# Patient Record
Sex: Female | Born: 1990 | Hispanic: Yes | Marital: Single | Smoking: Never smoker
Health system: Southern US, Community
[De-identification: ages and names within clinical notes are randomized; demographics above are authoritative.]

## PROBLEM LIST (undated history)

## (undated) ENCOUNTER — Inpatient Hospital Stay: Payer: Self-pay

## (undated) DIAGNOSIS — O021 Missed abortion: Secondary | ICD-10-CM

## (undated) DIAGNOSIS — Z800 Family history of malignant neoplasm of digestive organs: Secondary | ICD-10-CM

## (undated) DIAGNOSIS — E669 Obesity, unspecified: Secondary | ICD-10-CM

## (undated) DIAGNOSIS — Z8041 Family history of malignant neoplasm of ovary: Secondary | ICD-10-CM

## (undated) DIAGNOSIS — G43909 Migraine, unspecified, not intractable, without status migrainosus: Secondary | ICD-10-CM

## (undated) HISTORY — DX: Family history of malignant neoplasm of ovary: Z80.41

## (undated) HISTORY — PX: DILATION AND CURETTAGE OF UTERUS: SHX78

## (undated) HISTORY — DX: Family history of malignant neoplasm of digestive organs: Z80.0

## (undated) HISTORY — DX: Migraine, unspecified, not intractable, without status migrainosus: G43.909

## (undated) HISTORY — DX: Missed abortion: O02.1

## (undated) SURGERY — Surgical Case
Anesthesia: Spinal

---
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
## 2010-04-01 ENCOUNTER — Inpatient Hospital Stay: Payer: Self-pay | Admitting: Internal Medicine

## 2010-05-01 ENCOUNTER — Emergency Department: Payer: Self-pay | Admitting: Emergency Medicine

## 2010-06-04 ENCOUNTER — Emergency Department: Payer: Self-pay | Admitting: Emergency Medicine

## 2010-06-05 ENCOUNTER — Ambulatory Visit: Payer: Self-pay | Admitting: Obstetrics & Gynecology

## 2011-01-24 ENCOUNTER — Emergency Department: Payer: Self-pay | Admitting: Emergency Medicine

## 2011-07-23 ENCOUNTER — Inpatient Hospital Stay: Payer: Self-pay | Admitting: Obstetrics and Gynecology

## 2012-08-06 ENCOUNTER — Emergency Department: Payer: Self-pay | Admitting: Emergency Medicine

## 2015-01-27 LAB — HM PAP SMEAR

## 2015-02-25 ENCOUNTER — Other Ambulatory Visit: Payer: Self-pay

## 2015-02-25 ENCOUNTER — Encounter: Payer: Self-pay | Admitting: *Deleted

## 2015-02-25 DIAGNOSIS — O034 Incomplete spontaneous abortion without complication: Secondary | ICD-10-CM | POA: Diagnosis present

## 2015-02-25 DIAGNOSIS — M2629 Other anomalies of dental arch relationship: Secondary | ICD-10-CM | POA: Diagnosis not present

## 2015-02-25 DIAGNOSIS — Z79899 Other long term (current) drug therapy: Secondary | ICD-10-CM | POA: Diagnosis not present

## 2015-02-26 ENCOUNTER — Ambulatory Visit: Payer: Medicaid Other | Admitting: Certified Registered Nurse Anesthetist

## 2015-02-26 ENCOUNTER — Encounter
Admission: RE | Disposition: A | Discharge status: Home / Self Care | Payer: Self-pay | Source: Ambulatory Visit | Attending: Obstetrics & Gynecology

## 2015-02-26 ENCOUNTER — Encounter: Payer: Self-pay | Admitting: *Deleted

## 2015-02-26 ENCOUNTER — Ambulatory Visit
Admission: RE | Admit: 2015-02-26 | Discharge: 2015-02-26 | Disposition: A | Discharge status: Home / Self Care | Payer: Medicaid Other | Source: Ambulatory Visit | Attending: Obstetrics & Gynecology | Admitting: Obstetrics & Gynecology

## 2015-02-26 DIAGNOSIS — O034 Incomplete spontaneous abortion without complication: Secondary | ICD-10-CM | POA: Insufficient documentation

## 2015-02-26 DIAGNOSIS — Z79899 Other long term (current) drug therapy: Secondary | ICD-10-CM | POA: Insufficient documentation

## 2015-02-26 DIAGNOSIS — M2629 Other anomalies of dental arch relationship: Secondary | ICD-10-CM | POA: Insufficient documentation

## 2015-02-26 HISTORY — PX: DILATION AND EVACUATION: SHX1459

## 2015-02-26 LAB — TYPE AND SCREEN
ABO/RH(D): A POS
ANTIBODY SCREEN: NEGATIVE

## 2015-02-26 SURGERY — DILATION AND EVACUATION, UTERUS
Anesthesia: General

## 2015-02-26 MED ORDER — PROPOFOL 10 MG/ML IV BOLUS
INTRAVENOUS | Status: DC | PRN
  Administered 2015-02-26: 120 mg via INTRAVENOUS

## 2015-02-26 MED ORDER — FENTANYL CITRATE (PF) 100 MCG/2ML IJ SOLN
INTRAMUSCULAR | Status: DC | PRN
Start: 2015-02-26 — End: 2015-02-26
  Administered 2015-02-26: 100 ug via INTRAVENOUS

## 2015-02-26 MED ORDER — ONDANSETRON HCL 4 MG/2ML IJ SOLN
INTRAMUSCULAR | Status: DC | PRN
Start: 2015-02-26 — End: 2015-02-26
  Administered 2015-02-26: 4 mg via INTRAVENOUS

## 2015-02-26 MED ORDER — KETOROLAC TROMETHAMINE 15 MG/ML IJ SOLN
INTRAMUSCULAR | Status: DC | PRN
  Administered 2015-02-26: 15 mg via INTRAVENOUS

## 2015-02-26 MED ORDER — MIDAZOLAM HCL 2 MG/2ML IJ SOLN
INTRAMUSCULAR | Status: DC | PRN
  Administered 2015-02-26: 2 mg via INTRAVENOUS

## 2015-02-26 MED ORDER — FENTANYL CITRATE (PF) 100 MCG/2ML IJ SOLN
25.0000 ug | INTRAMUSCULAR | Status: DC | PRN
  Administered 2015-02-26 (×2): 25 ug via INTRAVENOUS

## 2015-02-26 MED ORDER — LACTATED RINGERS IV SOLN
INTRAVENOUS | Status: DC
  Administered 2015-02-26: 1000 mL via INTRAVENOUS

## 2015-02-26 MED ORDER — FENTANYL CITRATE (PF) 100 MCG/2ML IJ SOLN
INTRAMUSCULAR | Status: AC
  Filled 2015-02-26: qty 2

## 2015-02-26 MED ORDER — DOXYCYCLINE HYCLATE 50 MG PO CAPS: 100.0000 mg | ORAL_CAPSULE | Freq: Two times a day (BID) | ORAL | Status: DC

## 2015-02-26 MED ORDER — ONDANSETRON HCL 4 MG/2ML IJ SOLN: 4.0000 mg | Freq: Once | INTRAMUSCULAR | Status: DC | PRN

## 2015-02-26 MED ORDER — METHYLERGONOVINE MALEATE 0.2 MG PO TABS: 0.2000 mg | ORAL_TABLET | Freq: Four times a day (QID) | ORAL | Status: DC

## 2015-02-26 MED ORDER — IBUPROFEN 200 MG PO TABS: 200.0000 mg | ORAL_TABLET | Freq: Four times a day (QID) | ORAL | Status: DC | PRN

## 2015-02-26 MED ORDER — LIDOCAINE HCL (CARDIAC) 20 MG/ML IV SOLN
INTRAVENOUS | Status: DC | PRN
  Administered 2015-02-26: 100 mg via INTRAVENOUS

## 2015-02-26 MED ORDER — PHENYLEPHRINE HCL 10 MG/ML IJ SOLN
INTRAMUSCULAR | Status: DC | PRN
  Administered 2015-02-26: 100 ug via INTRAVENOUS

## 2015-02-26 SURGICAL SUPPLY — 18 items
BAG COUNTER SPONGE EZ (MISCELLANEOUS) ×2 IMPLANT
CATH ROBINSON RED A/P 16FR (CATHETERS) ×4 IMPLANT
FILTER UTR ASPR SPEC (MISCELLANEOUS) ×1 IMPLANT
FLTR UTR ASPR SPEC (MISCELLANEOUS) ×2
GLOVE BIO SURGEON STRL SZ8 (GLOVE) ×8 IMPLANT
GOWN STRL REUS W/ TWL LRG LVL3 (GOWN DISPOSABLE) ×1 IMPLANT
GOWN STRL REUS W/ TWL XL LVL3 (GOWN DISPOSABLE) ×1 IMPLANT
GOWN STRL REUS W/TWL LRG LVL3 (GOWN DISPOSABLE) ×1
GOWN STRL REUS W/TWL XL LVL3 (GOWN DISPOSABLE) ×1
KIT BERKELEY 1ST TRIMESTER 3/8 (MISCELLANEOUS) ×2 IMPLANT
KIT RM TURNOVER CYSTO AR (KITS) ×2 IMPLANT
NS IRRIG 500ML POUR BTL (IV SOLUTION) ×2 IMPLANT
PACK DNC HYST (MISCELLANEOUS) ×2 IMPLANT
PAD OB MATERNITY 4.3X12.25 (PERSONAL CARE ITEMS) ×2 IMPLANT
PAD PREP 24X41 OB/GYN DISP (PERSONAL CARE ITEMS) ×2 IMPLANT
SET BERKELEY SUCTION TUBING (SUCTIONS) ×4 IMPLANT
TOWEL OR 17X26 4PK STRL BLUE (TOWEL DISPOSABLE) ×2 IMPLANT
VACURETTE 8 RIGID CVD (CANNULA) ×2 IMPLANT

## 2015-02-26 NOTE — OR Nursing (Signed)
Type and screen drawn by lab personnel and going down to lab.

## 2015-02-26 NOTE — Anesthesia Preprocedure Evaluation (Signed)
Anesthesia Evaluation  Patient identified by MRN, date of birth, ID band Patient awake    Reviewed: Allergy & Precautions, NPO status , Patient's Chart, lab work & pertinent test results  Airway Mallampati: II       Dental  (+) Chipped   Pulmonary          Cardiovascular     Neuro/Psych    GI/Hepatic   Endo/Other    Renal/GU      Musculoskeletal   Abdominal   Peds  Hematology   Anesthesia Other Findings Overbite.  Reproductive/Obstetrics                             Anesthesia Physical Anesthesia Plan  ASA: II  Anesthesia Plan: General   Post-op Pain Management:    Induction: Intravenous  Airway Management Planned: LMA  Additional Equipment:   Intra-op Plan:   Post-operative Plan:   Informed Consent: I have reviewed the patients History and Physical, chart, labs and discussed the procedure including the risks, benefits and alternatives for the proposed anesthesia with the patient or authorized representative who has indicated his/her understanding and acceptance.     Plan Discussed with: CRNA  Anesthesia Plan Comments:         Anesthesia Quick Evaluation

## 2015-02-26 NOTE — Anesthesia Postprocedure Evaluation (Signed)
  Anesthesia Post-op Note  Patient: Amber Duffy  Procedure(s) Performed: Procedure(s): DILATATION AND EVACUATION (N/A)  Anesthesia type:General  Patient location: PACU  Post pain: Pain level controlled  Post assessment: Post-op Vital signs reviewed, Patient's Cardiovascular Status Stable, Respiratory Function Stable, Patent Airway and No signs of Nausea or vomiting  Post vital signs: Reviewed and stable  Last Vitals:  Filed Vitals:   02/26/15 1744  BP: 92/56  Pulse: 56  Temp: 36.2 C  Resp: 16    Level of consciousness: awake, alert  and patient cooperative  Complications: No apparent anesthesia complications

## 2015-02-26 NOTE — Discharge Instructions (Addendum)
General Gynecological Post-Operative Instructions You may expect to feel dizzy, weak, and drowsy for as long as 24 hours after receiving the medicine that made you sleep (anesthetic).  Do not drive a car, ride a bicycle, participate in physical activities, or take public transportation until you are done taking narcotic pain medicines or as directed by your doctor.  Do not drink alcohol or take tranquilizers.  Do not take medicine that has not been prescribed by your doctor.  Do not sign important papers or make important decisions while on narcotic pain medicines.  Have a responsible person with you.  Take showers instead of baths until your doctor gives you permission to take baths.  Avoid heavy lifting (more than 10 pounds/4.5 kilograms), pushing, or pulling.  Avoid activities that may risk injury to your surgical site.  No sexual intercourse or placement of anything in the vagina for 2 weeks or as instructed by your doctor. If you have tubes coming from the wound site, check with your doctor regarding appropriate care of the tubes. Only take prescription or over-the-counter medicines  for pain, discomfort, or fever as directed by your doctor. Do not take aspirin. It can make you bleed. Take medicines (antibiotics) that kill germs if they are prescribed for you.  Call the office or go to the ER if:  You feel sick to your stomach (nauseous) and you start to throw up (vomit).  You have trouble eating or drinking.  You have an oral temperature above 101.  You have constipation that is not helped by adjusting diet or increasing fluid intake. Pain medicines are a common cause of constipation.  You have any other concerns. SEEK IMMEDIATE MEDICAL CARE IF:  You have persistent dizziness.  You have difficulty breathing or a congested sounding (croupy) cough.  You have an oral temperature above 102.5, not controlled by medicine.  There is increasing pain or tenderness near or in the surgical site.    AMBULATORY SURGERY  DISCHARGE INSTRUCTIONS   1) The drugs that you were given will stay in your system until tomorrow so for the next 24 hours you should not:  A) Drive an automobile B) Make any legal decisions C) Drink any alcoholic beverage   2) You may resume regular meals tomorrow.  Today it is better to start with liquids and gradually work up to solid foods.  You may eat anything you prefer, but it is better to start with liquids, then soup and crackers, and gradually work up to solid foods.   3) Please notify your doctor immediately if you have any unusual bleeding, trouble breathing, redness and pain at the surgery site, drainage, fever, or pain not relieved by medication. 4)   5) Your post-operative visit with Dr.    Druscilla Brownie                                 is: Date:                        Time:    Please call to schedule your post-operative visit.  6) Additional Instructions: 7)

## 2015-02-26 NOTE — OR Nursing (Signed)
C/o nausea.  No vomiting.  Safety check done.  Offered Peppermint Oil one gtt on cotton ball in room for treatment of nausea.  Fluid bolus started to aid nausea and support b/p

## 2015-02-26 NOTE — Op Note (Signed)
  Operative Note  02/26/2015 4:43 PM  PRE-OP DIAGNOSIS: INCOMPLETE AB   POST-OP DIAGNOSIS: same  SURGEON: Annamarie Major, MD, FACOG  ANESTHESIA: Choice   PROCEDURE: Procedure(s): DILATATION AND EVACUATION   ESTIMATED BLOOD LOSS: Minimal   SPECIMENS: @ORSPECIMEN @   COMPLICATIONS: none  DISPOSITION: PACU - hemodynamically stable.  CONDITION: stable  FINDINGS: Exam under anesthesia revealed a 8 wk size uterus without palpable adnexal masses.   INDICATION FOR PROCEDURE: Incomplete Abortion  PROCEDURE IN DETAIL: After informed consent was obtained, the patient was taken to the operating room where anesthesia was obtained without difficulty. The patient was positioned in the dorsal lithotomy position with [employer]. Time out was performed and an exam under anesthesia was performed. The vagina, perineum, and lower abdomen were prepped and draped in a normal sterile fashion. The bladder was emptied with an I&O catheter. A speculum was placed into the vagina and the cervix was grasped with a single toothed tenaculum. The uterus was sounded to 10cm.  The cervix was gently dilated to 20 [place] with  [employer] dilators. The suction was then tested and found to be adequate, and a 2mm rigid suction cannula was advanced into the uterine cavity. The suction was activated and the contents of the uterus were aspirated until no further tissue was obtained. The uterus was then curetted to gritty texture throughout.  At the end of the procedure bleeding was noted to beMinimal.  All instruments were then removed from the vagina.The patient tolerated the procedure well. All sponge, instrument, and needle counts were correct. The patient was taken to the recovery room in good condition.

## 2015-02-26 NOTE — H&P (Signed)
History and Physical Interval Note:  02/26/2015 4:04 PM  Amber Duffy  has presented today for surgery, with the diagnosis of INCOMPLETE AB  The various methods of treatment have been discussed with the patient and family. After consideration of risks, benefits and other options for treatment, the patient has consented to  Procedure(s): DILATATION AND EVACUATION (N/A) as a surgical intervention .  The patient's history has been reviewed, patient examined, no change in status, stable for surgery.  Pt has the following beta blocker history-  Not taking Beta Blocker.  I have reviewed the patient's chart and labs.  Questions were answered to the patient's satisfaction.       Tehila Sokolow Renae Fickle

## 2015-02-26 NOTE — Transfer of Care (Signed)
Immediate Anesthesia Transfer of Care Note  Patient: Trisha Mangle  Procedure(s) Performed: Procedure(s): DILATATION AND EVACUATION (N/A)  Patient Location: PACU  Anesthesia Type:General  Level of Consciousness: awake, alert , oriented and patient cooperative  Airway & Oxygen Therapy: Patient Spontanous Breathing and Patient connected to nasal cannula oxygen  Post-op Assessment: Report given to RN and Post -op Vital signs reviewed and stable  Post vital signs: Reviewed and stable  Last Vitals:  Filed Vitals:   02/26/15 1648  BP: 104/68  Pulse: 68  Temp: 36.7 C  Resp: 16    Complications: No apparent anesthesia complications

## 2015-02-27 ENCOUNTER — Encounter: Payer: Self-pay | Admitting: Obstetrics & Gynecology

## 2015-02-27 LAB — ABO/RH: ABO/RH(D): A POS

## 2015-03-02 LAB — SURGICAL PATHOLOGY

## 2015-05-11 ENCOUNTER — Encounter: Payer: Self-pay | Admitting: Urgent Care

## 2015-05-11 ENCOUNTER — Emergency Department: Payer: Medicaid Other

## 2015-05-11 DIAGNOSIS — O209 Hemorrhage in early pregnancy, unspecified: Secondary | ICD-10-CM | POA: Diagnosis present

## 2015-05-11 DIAGNOSIS — Z79899 Other long term (current) drug therapy: Secondary | ICD-10-CM | POA: Insufficient documentation

## 2015-05-11 DIAGNOSIS — Z3A01 Less than 8 weeks gestation of pregnancy: Secondary | ICD-10-CM | POA: Diagnosis not present

## 2015-05-11 DIAGNOSIS — O200 Threatened abortion: Secondary | ICD-10-CM | POA: Diagnosis not present

## 2015-05-11 LAB — BASIC METABOLIC PANEL
Anion gap: 6 (ref 5–15)
BUN: 8 mg/dL (ref 6–20)
CO2: 25 mmol/L (ref 22–32)
Calcium: 9.4 mg/dL (ref 8.9–10.3)
Chloride: 104 mmol/L (ref 101–111)
Creatinine, Ser: 0.63 mg/dL (ref 0.44–1.00)
GFR calc Af Amer: >60 mL/min (ref >60)
GLUCOSE: 104 mg/dL — AB (ref 65–99)
Potassium: 3.4 mmol/L — ABNORMAL LOW (ref 3.5–5.1)
Sodium: 135 mmol/L (ref 135–145)

## 2015-05-11 LAB — CBC
HCT: 37.3 % (ref 35.0–47.0)
Hemoglobin: 12.3 g/dL (ref 12.0–16.0)
MCH: 28.4 pg (ref 26.0–34.0)
MCHC: 32.9 g/dL (ref 32.0–36.0)
MCV: 86.3 fL (ref 80.0–100.0)
PLATELETS: 213 10*3/uL (ref 150–440)
RBC: 4.33 MIL/uL (ref 3.80–5.20)
RDW: 13.4 % (ref 11.5–14.5)
WBC: 8.7 10*3/uL (ref 3.6–11.0)

## 2015-05-11 LAB — HCG, QUANTITATIVE, PREGNANCY: hCG, Beta Chain, Quant, S: 23228 m[IU]/mL — ABNORMAL HIGH (ref <5)

## 2015-05-11 NOTE — ED Notes (Signed)
Patient presents with c/o lower abd pain and vaginal bleeding x 2 days. Of note, patient is a G4-P1-A2 - unsure of IUP or gestational age - "I am about 3 months". Denies N/V/D/F.

## 2015-05-12 ENCOUNTER — Emergency Department
Admission: EM | Admit: 2015-05-12 | Discharge: 2015-05-12 | Disposition: A | Discharge status: Home / Self Care | Payer: Medicaid Other | Attending: Emergency Medicine | Admitting: Emergency Medicine

## 2015-05-12 DIAGNOSIS — O200 Threatened abortion: Secondary | ICD-10-CM

## 2015-05-12 LAB — ABO/RH: ABO/RH(D): A POS

## 2015-05-12 MED ORDER — OXYCODONE-ACETAMINOPHEN 5-325 MG PO TABS
1.0000 | ORAL_TABLET | Freq: Once | ORAL | Status: AC
  Administered 2015-05-12: 1 via ORAL
  Filled 2015-05-12: qty 1

## 2015-05-12 MED ORDER — ONDANSETRON 4 MG PO TBDP
4.0000 mg | ORAL_TABLET | Freq: Once | ORAL | Status: AC
  Administered 2015-05-12: 4 mg via ORAL
  Filled 2015-05-12: qty 1

## 2015-05-12 NOTE — ED Notes (Signed)
Patient with no complaints at this time. Respirations even and unlabored. Skin warm/dry. Discharge instructions reviewed with patient at this time. Patient given opportunity to voice concerns/ask questions. Patient discharged at this time and left Emergency Department with steady gait.   

## 2015-05-12 NOTE — Discharge Instructions (Signed)

## 2015-05-12 NOTE — ED Provider Notes (Signed)
[facility] Emergency Department Provider Note  ____________________________________________  Time seen: 12:15 AM  I have reviewed the triage vital signs and the nursing notes.   HISTORY  Chief Complaint Vaginal Bleeding and Abdominal Pain      HPI Amber Duffy is a 24 y.o. female presents with left lower quadrant abdominal pain and vaginal bleeding 2 days. Of note patient states she is approximately 3 months pregnant. Patient is a G4 para 1 with 2 miscarriages most recent of which was "May or June". Patient denies any nausea no vomiting no fever. Patient is followed by [facility]    Past medical history 2 miscarriages There are no active problems to display for this patient.   Past Surgical History  Procedure Laterality Date  . Dilation and curettage of uterus    . Dilation and evacuation N/A 02/26/2015    Procedure: DILATATION AND EVACUATION;  Surgeon: Nadara Mustard, MD;  Location: [facility] ORS;  Service: Gynecology;  Laterality: N/A;    Current Outpatient Rx  Name  Route  Sig  Dispense  Refill  . doxycycline (VIBRAMYCIN) 50 MG capsule   Oral   Take 2 capsules (100 mg total) by mouth 2 (two) times daily.   12 capsule   0   . Ferrous Fumarate (IRON) 18 MG TBCR   Oral   Take by mouth.         Marland Kitchen ibuprofen (ADVIL) 200 MG tablet   Oral   Take 1 tablet (200 mg total) by mouth every 6 (six) hours as needed for mild pain or moderate pain.   30 tablet   0   . methylergonovine (METHERGINE) 0.2 MG tablet   Oral   Take 1 tablet (0.2 mg total) by mouth 4 (four) times daily.   4 tablet   0     Allergies No known drug allergies No family history on file.  Social History Social History  Substance Use Topics  . Smoking status: Never Smoker   . Smokeless tobacco: Never Used  . Alcohol Use: No    Review of Systems  Constitutional: Negative for fever. Eyes: Negative for visual changes. ENT: Negative for sore  throat. Cardiovascular: Negative for chest pain. Respiratory: Negative for shortness of breath. Gastrointestinal: Negative for abdominal pain, vomiting and diarrhea. Genitourinary: Negative for dysuria. Musculoskeletal: Negative for back pain. Skin: Negative for rash. Neurological: Negative for headaches, focal weakness or numbness.   10-point ROS otherwise negative.  ____________________________________________   PHYSICAL EXAM:  VITAL SIGNS: ED Triage Vitals  Enc Vitals Group     BP 05/11/15 2119 103/62 mmHg     Pulse Rate 05/11/15 2119 86     Resp 05/11/15 2119 18     Temp 05/11/15 2119 97.6 F (36.4 C)     Temp Source 05/11/15 2119 Oral     SpO2 05/11/15 2119 98 %     Weight 05/11/15 2119 170 lb (77.111 kg)     Height 05/11/15 2119 5\' 3"  (1.6 m)     Head Cir --      Peak Flow --      Pain Score 05/11/15 2120 8     Pain Loc --      Pain Edu? --      Excl. in GC? --      Constitutional: Alert and oriented. Well appearing and in no distress. Eyes: Conjunctivae are normal. PERRL. Normal extraocular movements. ENT   Head: Normocephalic and atraumatic.   Nose: No congestion/rhinnorhea.  Mouth/Throat: Mucous membranes are moist.   Neck: No stridor. Hematological/Lymphatic/Immunilogical: No cervical lymphadenopathy. Cardiovascular: Normal rate, regular rhythm. Normal and symmetric distal pulses are present in all extremities. No murmurs, rubs, or gallops. Respiratory: Normal respiratory effort without tachypnea nor retractions. Breath sounds are clear and equal bilaterally. No wheezes/rales/rhonchi. Gastrointestinal: Left pelvic pain with palpation. No distention. There is no CVA tenderness. Genitourinary: deferred Musculoskeletal: Nontender with normal range of motion in all extremities. No joint effusions.  No lower extremity tenderness nor edema. Neurologic:  Normal speech and language. No gross focal neurologic deficits are appreciated. Speech is normal.   Skin:  Skin is warm, dry and intact. No rash noted. Psychiatric: Mood and affect are normal. Speech and behavior are normal. Patient exhibits appropriate insight and judgment.  ____________________________________________    LABS (pertinent positives/negatives)  Labs Reviewed  HCG, QUANTITATIVE, PREGNANCY - Abnormal; Notable for the following:    hCG, Beta Chain, Quant, S 23228 (*)    All other components within normal limits  BASIC METABOLIC PANEL - Abnormal; Notable for the following:    Potassium 3.4 (*)    Glucose, Bld 104 (*)    All other components within normal limits  CBC  ABO/RH     RADIOLOGY     US Ob Transvaginal (Final result) Result time: 05/11/15 23:43:40   Final result by Rad Results In Interface (05/11/15 23:43:40)   Narrative:   CLINICAL DATA: Vaginal bleeding. Pelvic pain. Quantitative beta HCG 23,228.  EXAM: OBSTETRIC <14 WK [place] AND TRANSVAGINAL OB US  TECHNIQUE: Both transabdominal and transvaginal ultrasound examinations were performed for complete evaluation of the gestation as well as the maternal uterus, adnexal regions, and pelvic cul-de-sac. Transvaginal technique was performed to assess early pregnancy.  COMPARISON: None.  FINDINGS: Intrauterine gestational sac: Visualized, mildly irregular  Yolk sac: Visualized  Embryo: Visualized  Cardiac Activity: Not visualized  Heart Rate: 0 bpm  CRL: 8 mm  6 w  6 d         [place] EDC: 12/29/15  Maternal uterus/adnexae: Left ovarian simple appearing cyst 3.1 x 2.5 x 2.4 cm. Right ovary appears normal. No free fluid.  IMPRESSION: Intrauterine irregular gestational sac with yolk sac and 8 mm fetal pole but no cardiac activity visualized. Findings meet definitive criteria for failed pregnancy. This follows SRU consensus guidelines: Diagnostic Criteria for Nonviable Pregnancy Early in the First Trimester. [facility] Mis J Med [phone].   Electronically Signed By:  Christiana Pellant M.D. On: 05/11/2015 23:43          US OB Comp Less 14 Wks (Final result) Result time: 05/11/15 23:43:40   Final result by Rad Results In Interface (05/11/15 23:43:40)   Narrative:   CLINICAL DATA: Vaginal bleeding. Pelvic pain. Quantitative beta HCG 23,228.  EXAM: OBSTETRIC <14 WK [place] AND TRANSVAGINAL OB US  TECHNIQUE: Both transabdominal and transvaginal ultrasound examinations were performed for complete evaluation of the gestation as well as the maternal uterus, adnexal regions, and pelvic cul-de-sac. Transvaginal technique was performed to assess early pregnancy.  COMPARISON: None.  FINDINGS: Intrauterine gestational sac: Visualized, mildly irregular  Yolk sac: Visualized  Embryo: Visualized  Cardiac Activity: Not visualized  Heart Rate: 0 bpm  CRL: 8 mm  6 w  6 d         [place] EDC: 12/29/15  Maternal uterus/adnexae: Left ovarian simple appearing cyst 3.1 x 2.5 x 2.4 cm. Right ovary appears normal. No free fluid.  IMPRESSION: Intrauterine irregular gestational sac with yolk sac and 8 mm fetal  pole but no cardiac activity visualized. Findings meet definitive criteria for failed pregnancy. This follows SRU consensus guidelines: Diagnostic Criteria for Nonviable Pregnancy Early in the First Trimester. [facility] Mis J Med [id number].   Electronically Signed By: Christiana Pellant M.D. On: 05/11/2015 23:43       INITIAL IMPRESSION / ASSESSMENT AND PLAN / ED COURSE  Pertinent labs & imaging results that were available during my care of the patient were reviewed by me and considered in my medical decision making (see chart for details).  I informed the patient of the irregular shape gestational sac with no cardiac activity and hence fetal demise  ____________________________________________   FINAL CLINICAL IMPRESSION(S) / ED DIAGNOSES  Final diagnoses:  Miscarriage, threatened, early pregnancy      Darci Current, MD 05/22/15 [id number]

## 2015-05-12 NOTE — ED Notes (Addendum)
MD at bedside, completing medical evaluation.  

## 2015-05-14 ENCOUNTER — Encounter: Payer: Self-pay | Admitting: *Deleted

## 2015-05-14 ENCOUNTER — Emergency Department
Admission: EM | Admit: 2015-05-14 | Discharge: 2015-05-14 | Disposition: A | Discharge status: Home / Self Care | Payer: Medicaid Other | Attending: Emergency Medicine | Admitting: Emergency Medicine

## 2015-05-14 DIAGNOSIS — S61216A Laceration without foreign body of right little finger without damage to nail, initial encounter: Secondary | ICD-10-CM | POA: Diagnosis not present

## 2015-05-14 DIAGNOSIS — Z79899 Other long term (current) drug therapy: Secondary | ICD-10-CM | POA: Diagnosis not present

## 2015-05-14 DIAGNOSIS — W260XXA Contact with knife, initial encounter: Secondary | ICD-10-CM | POA: Diagnosis not present

## 2015-05-14 DIAGNOSIS — Z792 Long term (current) use of antibiotics: Secondary | ICD-10-CM | POA: Insufficient documentation

## 2015-05-14 DIAGNOSIS — N939 Abnormal uterine and vaginal bleeding, unspecified: Secondary | ICD-10-CM | POA: Diagnosis not present

## 2015-05-14 DIAGNOSIS — Y9389 Activity, other specified: Secondary | ICD-10-CM | POA: Insufficient documentation

## 2015-05-14 DIAGNOSIS — Y9289 Other specified places as the place of occurrence of the external cause: Secondary | ICD-10-CM | POA: Diagnosis not present

## 2015-05-14 DIAGNOSIS — Y998 Other external cause status: Secondary | ICD-10-CM | POA: Diagnosis not present

## 2015-05-14 DIAGNOSIS — Z23 Encounter for immunization: Secondary | ICD-10-CM | POA: Diagnosis not present

## 2015-05-14 LAB — URINALYSIS COMPLETE WITH MICROSCOPIC (ARMC ONLY)
Bilirubin Urine: NEGATIVE
GLUCOSE, UA: NEGATIVE mg/dL
Ketones, ur: NEGATIVE mg/dL
Leukocytes, UA: NEGATIVE
NITRITE: NEGATIVE
Protein, ur: NEGATIVE mg/dL
Specific Gravity, Urine: 1.025 (ref 1.005–1.030)
pH: 5 (ref 5.0–8.0)

## 2015-05-14 LAB — BASIC METABOLIC PANEL
ANION GAP: 6 (ref 5–15)
BUN: 11 mg/dL (ref 6–20)
CALCIUM: 8.9 mg/dL (ref 8.9–10.3)
CO2: 26 mmol/L (ref 22–32)
Chloride: 107 mmol/L (ref 101–111)
Creatinine, Ser: 0.55 mg/dL (ref 0.44–1.00)
GFR calc non Af Amer: >60 mL/min (ref >60)
Glucose, Bld: 82 mg/dL (ref 65–99)
Potassium: 3.6 mmol/L (ref 3.5–5.1)
Sodium: 139 mmol/L (ref 135–145)

## 2015-05-14 LAB — CBC WITH DIFFERENTIAL/PLATELET
BASOS ABS: 0 10*3/uL (ref 0–0.1)
BASOS PCT: 1 %
Eosinophils Absolute: 0.1 10*3/uL (ref 0–0.7)
Eosinophils Relative: 1 %
HEMATOCRIT: 35.3 % (ref 35.0–47.0)
Hemoglobin: 11.7 g/dL — ABNORMAL LOW (ref 12.0–16.0)
Lymphocytes Relative: 26 %
Lymphs Abs: 2.2 10*3/uL (ref 1.0–3.6)
MCH: 28.8 pg (ref 26.0–34.0)
MCHC: 33.2 g/dL (ref 32.0–36.0)
MCV: 86.7 fL (ref 80.0–100.0)
Monocytes Absolute: 0.8 10*3/uL (ref 0.2–0.9)
Monocytes Relative: 10 %
NEUTROS ABS: 5.1 10*3/uL (ref 1.4–6.5)
NEUTROS PCT: 62 %
Platelets: 195 10*3/uL (ref 150–440)
RBC: 4.07 MIL/uL (ref 3.80–5.20)
RDW: 12.9 % (ref 11.5–14.5)
WBC: 8.3 10*3/uL (ref 3.6–11.0)

## 2015-05-14 LAB — HCG, QUANTITATIVE, PREGNANCY: HCG, BETA CHAIN, QUANT, S: 18259 m[IU]/mL — AB (ref <5)

## 2015-05-14 MED ORDER — LIDOCAINE-EPINEPHRINE (PF) 1 %-1:200000 IJ SOLN
30.0000 mL | Freq: Once | INTRAMUSCULAR | Status: AC
  Administered 2015-05-14: 30 mL via INTRADERMAL
  Filled 2015-05-14: qty 30

## 2015-05-14 MED ORDER — TETANUS-DIPHTH-ACELL PERTUSSIS 5-2.5-18.5 LF-MCG/0.5 IM SUSP
0.5000 mL | Freq: Once | INTRAMUSCULAR | Status: AC
  Administered 2015-05-14: 0.5 mL via INTRAMUSCULAR
  Filled 2015-05-14: qty 0.5

## 2015-05-14 NOTE — ED Provider Notes (Signed)
[facility] Emergency Department Provider Note  ____________________________________________  Time seen: 9:20 PM  I have reviewed the triage vital signs and the nursing notes.   HISTORY  Chief Complaint Vaginal Bleeding; Dizziness; and Laceration    HPI Amber Duffy is a 24 y.o. female who presents to the ED after cutting her right fifth finger with a knife today. She states that she is putting away but was not paying attention and cut the finger. No limitation in movement but it does hurt and had bled a lot initially. She is concerned that she may have lost much blood because she is anemic at baseline and recently had a miscarriage and has had some vaginal bleeding over the last 4 days. No syncope chest pain shortness of breath fever chills nausea vomiting or diarrhea.     No past medical history on file. Spontaneous abortion  There are no active problems to display for this patient.    Past Surgical History  Procedure Laterality Date  . Dilation and curettage of uterus    . Dilation and evacuation N/A 02/26/2015    Procedure: DILATATION AND EVACUATION;  Surgeon: Nadara Mustard, MD;  Location: [facility] ORS;  Service: Gynecology;  Laterality: N/A;     Current Outpatient Rx  Name  Route  Sig  Dispense  Refill  . doxycycline (VIBRAMYCIN) 50 MG capsule   Oral   Take 2 capsules (100 mg total) by mouth 2 (two) times daily.   12 capsule   0   . ibuprofen (ADVIL) 200 MG tablet   Oral   Take 1 tablet (200 mg total) by mouth every 6 (six) hours as needed for mild pain or moderate pain.   30 tablet   0   . methylergonovine (METHERGINE) 0.2 MG tablet   Oral   Take 1 tablet (0.2 mg total) by mouth 4 (four) times daily.   4 tablet   0      Allergies Review of patient's allergies indicates no known allergies.   No family history on file.  Social History Social History  Substance Use Topics  . Smoking status: Never Smoker   . Smokeless  tobacco: Never Used  . Alcohol Use: No    Review of Systems  Constitutional:   No fever or chills. No weight changes Eyes:   No blurry vision or double vision.  ENT:   No sore throat. Cardiovascular:   No chest pain. Respiratory:   No dyspnea or cough. Gastrointestinal:   Negative for abdominal pain, vomiting and diarrhea.  No BRBPR or melena. Genitourinary:   Negative for dysuria, urinary retention, bloody urine, or difficulty urinating. Recent miscarriage with persistent vaginal bleeding. No foul discharge or increasing pain. Musculoskeletal:   Negative for back pain. Right fifth finger pain and laceration Skin:   Negative for rash. Neurological:   Negative for headaches, focal weakness or numbness. Psychiatric:  No anxiety or depression.   Endocrine:  No hot/cold intolerance, changes in energy, or sleep difficulty.  10-point ROS otherwise negative.  ____________________________________________   PHYSICAL EXAM:  VITAL SIGNS: ED Triage Vitals  Enc Vitals Group     BP 05/14/15 2105 104/65 mmHg     Pulse Rate 05/14/15 2105 89     Resp 05/14/15 2105 20     Temp 05/14/15 2105 98.1 F (36.7 C)     Temp Source 05/14/15 2105 Oral     SpO2 05/14/15 2105 98 %     Weight 05/14/15 2105 170 lb (77.111  kg)     Height 05/14/15 2105  (1.6 m)     Head Cir --      Peak Flow --      Pain Score 05/14/15 2105 9     Pain Loc --      Pain Edu? --      Excl. in GC? --      Constitutional:   Alert and oriented. Well appearing and in no distress. Eyes:   No scleral icterus. No conjunctival pallor. PERRL. EOMI ENT   Head:   Normocephalic and atraumatic.   Nose:   No congestion/rhinnorhea. No septal hematoma   Mouth/Throat:   MMM, no pharyngeal erythema. No peritonsillar mass. No uvula shift.   Neck:   No stridor. No SubQ emphysema. No meningismus. Hematological/Lymphatic/Immunilogical:   No cervical lymphadenopathy. Cardiovascular:   RRR. Normal and symmetric distal  pulses are present in all extremities. No murmurs, rubs, or gallops. Respiratory:   Normal respiratory effort without tachypnea nor retractions. Breath sounds are clear and equal bilaterally. No wheezes/rales/rhonchi. Gastrointestinal:   Soft and nontender. No distention. There is no CVA tenderness.  No rebound, rigidity, or guarding. Genitourinary:   deferred Musculoskeletal:   Mild tenderness in the right fifth finger over the area of the laceration which is about 1 cm triangular shaped flap over the radial aspect of the right fifth finger distal phalanx. The nailbed is not involved. The joint does not appear to be involved. She has intact range of motion. Hemostatic on my exam, and the laceration extends only through the dermis and does not involve any tendons or neurovascular structures.. Neurologic:   Normal speech and language.  CN 2-10 normal. Motor grossly intact. No pronator drift.  Normal gait. No gross focal neurologic deficits are appreciated.  Skin:    Skin is warm, dry and intact. No rash noted.  No petechiae, purpura, or bullae. Psychiatric:   Mood and affect are normal. Speech and behavior are normal. Patient exhibits appropriate insight and judgment.  ____________________________________________    LABS (pertinent positives/negatives) (all labs ordered are listed, but only abnormal results are displayed) Labs Reviewed  HCG, QUANTITATIVE, PREGNANCY - Abnormal; Notable for the following:    hCG, Beta Chain, Quant, S 18259 (*)    All other components within normal limits  CBC WITH DIFFERENTIAL/PLATELET - Abnormal; Notable for the following:    Hemoglobin 11.7 (*)    All other components within normal limits  URINALYSIS COMPLETEWITH MICROSCOPIC ([facility] ONLY) - Abnormal; Notable for the following:    Color, Urine YELLOW (*)    APPearance CLEAR (*)    Hgb urine dipstick 3+ (*)    Bacteria, UA MANY (*)    Squamous Epithelial / LPF 0-5 (*)    All other components within normal  limits  BASIC METABOLIC PANEL   ____________________________________________   EKG    ____________________________________________    RADIOLOGY    ____________________________________________   PROCEDURES Digital block performed with 1% lidocaine with epinephrine at the base of the right fifth finger to obtain anesthesia for laceration repair.  LACERATION REPAIR Performed by: Sharman Cheek Authorized by: Sharman Cheek Consent: Verbal consent obtained. Risks and benefits: risks, benefits and alternatives were discussed Consent given by: patient Patient identity confirmed: provided demographic data Prepped and Draped in normal sterile fashion Wound explored  Laceration Location: Right fifth finger  Laceration Length: 1cm  No Foreign Bodies seen or palpated  Anesthesia: local infiltration  Local anesthetic: lidocaine 1% with epinephrine  Anesthetic total: 1  ml  Irrigation method: syringe Amount of cleaning: standard  Skin closure: 5-0 Monocryl   Number of sutures: 3  Technique: Simple interrupted   Patient tolerance: Patient tolerated the procedure well with no immediate complications.   ____________________________________________   INITIAL IMPRESSION / ASSESSMENT AND PLAN / ED COURSE  Pertinent labs & imaging results that were available during my care of the patient were reviewed by me and considered in my medical decision making (see chart for details).  Patient presents with finger laceration. The vaginal bleeding is expected clinical course for a spontaneous miscarriage, and there are no symptoms or findings to suggest that she is developing infection or has retained POC's. Finger injury will be cleansed and repaired with sutures, as location near the DIP makes it unlikely to heal optimally with Steri-Strips or adhesive skin glue. She is unable to remember her last tetanus shot so we'll give her a tetanus booster here in the  ED.     ____________________________________________   FINAL CLINICAL IMPRESSION(S) / ED DIAGNOSES  Final diagnoses:  Laceration of fifth finger, right, initial encounter      Sharman Cheek, MD 05/14/15 2240

## 2015-05-14 NOTE — ED Notes (Signed)
MD Scotty Court at bedside to answer questions regarding miscarriage/abdominal pain.

## 2015-05-14 NOTE — Discharge Instructions (Signed)
Your blood type is A+, which means the you do not need any additional medications related to your recent miscarriage to protect against any negative blood reactions. We repaired your finger laceration with absorbable sutures that will come out on their own.  Laceration Care, Adult A laceration is a cut or lesion that goes through all layers of the skin and into the tissue just beneath the skin. TREATMENT  Some lacerations may not require closure. Some lacerations may not be able to be closed due to an increased risk of infection. It is important to see your caregiver as soon as possible after an injury to minimize the risk of infection and maximize the opportunity for successful closure. If closure is appropriate, pain medicines may be given, if needed. The wound will be cleaned to help prevent infection. Your caregiver will use stitches (sutures), staples, wound glue (adhesive), or skin adhesive strips to repair the laceration. These tools bring the skin edges together to allow for faster healing and a better cosmetic outcome. However, all wounds will heal with a scar. Once the wound has healed, scarring can be minimized by covering the wound with sunscreen during the day for 1 full year. HOME CARE INSTRUCTIONS  For sutures or staples:  Keep the wound clean and dry.  If you were given a bandage (dressing), you should change it at least once a day. Also, change the dressing if it becomes wet or dirty, or as directed by your caregiver.  Wash the wound with soap and water 2 times a day. Rinse the wound off with water to remove all soap. Pat the wound dry with a clean towel.  After cleaning, apply a thin layer of the antibiotic ointment as recommended by your caregiver. This will help prevent infection and keep the dressing from sticking.  You may shower as usual after the first 24 hours. Do not soak the wound in water until the sutures are removed.  Only take over-the-counter or prescription  medicines for pain, discomfort, or fever as directed by your caregiver.  Get your sutures or staples removed as directed by your caregiver. For skin adhesive strips:  Keep the wound clean and dry.  Do not get the skin adhesive strips wet. You may bathe carefully, using caution to keep the wound dry.  If the wound gets wet, pat it dry with a clean towel.  Skin adhesive strips will fall off on their own. You may trim the strips as the wound heals. Do not remove skin adhesive strips that are still stuck to the wound. They will fall off in time. For wound adhesive:  You may briefly wet your wound in the shower or bath. Do not soak or scrub the wound. Do not swim. Avoid periods of heavy perspiration until the skin adhesive has fallen off on its own. After showering or bathing, gently pat the wound dry with a clean towel.  Do not apply liquid medicine, cream medicine, or ointment medicine to your wound while the skin adhesive is in place. This may loosen the film before your wound is healed.  If a dressing is placed over the wound, be careful not to apply tape directly over the skin adhesive. This may cause the adhesive to be pulled off before the wound is healed.  Avoid prolonged exposure to sunlight or tanning lamps while the skin adhesive is in place. Exposure to ultraviolet light in the first year will darken the scar.  The skin adhesive will usually remain in place  for 5 to 10 days, then naturally fall off the skin. Do not pick at the adhesive film. You may need a tetanus shot if:  You cannot remember when you had your last tetanus shot.  You have never had a tetanus shot. If you get a tetanus shot, your arm may swell, get red, and feel warm to the touch. This is common and not a problem. If you need a tetanus shot and you choose not to have one, there is a rare chance of getting tetanus. Sickness from tetanus can be serious. SEEK MEDICAL CARE IF:   You have redness, swelling, or  increasing pain in the wound.  You see a red line that goes away from the wound.  You have yellowish-white fluid (pus) coming from the wound.  You have a fever.  You notice a bad smell coming from the wound or dressing.  Your wound breaks open before or after sutures have been removed.  You notice something coming out of the wound such as wood or glass.  Your wound is on your hand or foot and you cannot move a finger or toe. SEEK IMMEDIATE MEDICAL CARE IF:   Your pain is not controlled with prescribed medicine.  You have severe swelling around the wound causing pain and numbness or a change in color in your arm, hand, leg, or foot.  Your wound splits open and starts bleeding.  You have worsening numbness, weakness, or loss of function of any joint around or beyond the wound.  You develop painful lumps near the wound or on the skin anywhere on your body. MAKE SURE YOU:   Understand these instructions.  Will watch your condition.  Will get help right away if you are not doing well or get worse. Document Released: 08/29/2005 Document Revised: 11/21/2011 Document Reviewed: 02/22/2011 [facility] Patient Information 2015 [place], [place]. This information is not intended to replace advice given to you by your health care provider. Make sure you discuss any questions you have with your health care provider.

## 2015-05-14 NOTE — ED Notes (Signed)
Pt had a miscarriage on 4 days ago.  Now pt has dizziness and continues to have vag bleeding.  Pt reports abd pain.  Pt also has small laceration to right 5th finger.  Cut with a knife tonight.  Bleeding controlled.

## 2015-05-15 ENCOUNTER — Encounter: Payer: Self-pay | Admitting: *Deleted

## 2015-05-15 ENCOUNTER — Emergency Department
Admission: EM | Admit: 2015-05-15 | Discharge: 2015-05-16 | Disposition: A | Discharge status: Home / Self Care | Payer: Medicaid Other | Attending: Emergency Medicine | Admitting: Emergency Medicine

## 2015-05-15 DIAGNOSIS — O200 Threatened abortion: Secondary | ICD-10-CM | POA: Insufficient documentation

## 2015-05-15 DIAGNOSIS — Z79899 Other long term (current) drug therapy: Secondary | ICD-10-CM | POA: Insufficient documentation

## 2015-05-15 DIAGNOSIS — Z3A01 Less than 8 weeks gestation of pregnancy: Secondary | ICD-10-CM | POA: Insufficient documentation

## 2015-05-15 DIAGNOSIS — O039 Complete or unspecified spontaneous abortion without complication: Secondary | ICD-10-CM

## 2015-05-15 DIAGNOSIS — N939 Abnormal uterine and vaginal bleeding, unspecified: Secondary | ICD-10-CM

## 2015-05-15 DIAGNOSIS — O209 Hemorrhage in early pregnancy, unspecified: Secondary | ICD-10-CM | POA: Diagnosis present

## 2015-05-15 LAB — CBC WITH DIFFERENTIAL/PLATELET
BASOS ABS: 0.1 10*3/uL (ref 0–0.1)
BASOS PCT: 1 %
EOS ABS: 0.2 10*3/uL (ref 0–0.7)
EOS PCT: 2 %
HCT: 35 % (ref 35.0–47.0)
HEMOGLOBIN: 11.7 g/dL — AB (ref 12.0–16.0)
Lymphocytes Relative: 25 %
Lymphs Abs: 2.6 10*3/uL (ref 1.0–3.6)
MCH: 28.8 pg (ref 26.0–34.0)
MCHC: 33.5 g/dL (ref 32.0–36.0)
MCV: 86 fL (ref 80.0–100.0)
Monocytes Absolute: 0.6 10*3/uL (ref 0.2–0.9)
Monocytes Relative: 6 %
Neutro Abs: 6.8 10*3/uL — ABNORMAL HIGH (ref 1.4–6.5)
Neutrophils Relative %: 66 %
PLATELETS: 203 10*3/uL (ref 150–440)
RBC: 4.06 MIL/uL (ref 3.80–5.20)
RDW: 13 % (ref 11.5–14.5)
WBC: 10.3 10*3/uL (ref 3.6–11.0)

## 2015-05-15 LAB — BASIC METABOLIC PANEL
ANION GAP: 7 (ref 5–15)
BUN: 12 mg/dL (ref 6–20)
CHLORIDE: 106 mmol/L (ref 101–111)
CO2: 22 mmol/L (ref 22–32)
Calcium: 9 mg/dL (ref 8.9–10.3)
Creatinine, Ser: 0.67 mg/dL (ref 0.44–1.00)
Glucose, Bld: 122 mg/dL — ABNORMAL HIGH (ref 65–99)
POTASSIUM: 3.3 mmol/L — AB (ref 3.5–5.1)
SODIUM: 135 mmol/L (ref 135–145)

## 2015-05-15 LAB — TYPE AND SCREEN
ABO/RH(D): A POS
Antibody Screen: NEGATIVE

## 2015-05-15 MED ORDER — ONDANSETRON HCL 4 MG/2ML IJ SOLN
4.0000 mg | Freq: Once | INTRAMUSCULAR | Status: AC
  Administered 2015-05-15: 4 mg via INTRAVENOUS
  Filled 2015-05-15: qty 2

## 2015-05-15 MED ORDER — FENTANYL CITRATE (PF) 100 MCG/2ML IJ SOLN
75.0000 ug | Freq: Once | INTRAMUSCULAR | Status: AC
  Administered 2015-05-15: 75 ug via INTRAVENOUS
  Filled 2015-05-15: qty 2

## 2015-05-15 MED ORDER — SODIUM CHLORIDE 0.9 % IV BOLUS (SEPSIS)
1000.0000 mL | Freq: Once | INTRAVENOUS | Status: AC
  Administered 2015-05-15: 1000 mL via INTRAVENOUS

## 2015-05-15 NOTE — ED Provider Notes (Signed)
[facility] Emergency Department Provider Note   ____________________________________________  Time seen: 10 PM I have reviewed the triage vital signs and the triage nursing note.  HISTORY  Chief Complaint Threatened Miscarriage   Historian Patient  HPI Amber Duffy is a 24 y.o. female please been seen recently over this past week twice for vaginal bleeding in pregnancy, followed by incomplete miscarriage diagnosis. She is G4 P2 with her last miscarriage several months ago. She started having vaginal bleeding on Sunday when she thought she was possibly for [redacted] weeks pregnant. At that point she was told that her ultrasound was slightly abnormal and this might proceed to miscarriage. She was seen again yesterday after nearly passing out and sustaining a cut to her right pinky finger from a knife while she was passing out. She thinks she is passing out due to abdominal pain and vaginal bleeding. Today since 5 PM she has had significant increase in amount of vaginal bleeding. She's also had dizziness and nearly passing out again. She's been having moderate to severe lower abdominal cramping. Her pain right now as 9 out of 10. She is followed by with an OB/GYN   History reviewed. No pertinent past medical history.  There are no active problems to display for this patient.   Past Surgical History  Procedure Laterality Date  . Dilation and curettage of uterus    . Dilation and evacuation N/A 02/26/2015    Procedure: DILATATION AND EVACUATION;  Surgeon: Nadara Mustard, MD;  Location: [facility] ORS;  Service: Gynecology;  Laterality: N/A;    Current Outpatient Rx  Name  Route  Sig  Dispense  Refill  . doxycycline (VIBRAMYCIN) 50 MG capsule   Oral   Take 2 capsules (100 mg total) by mouth 2 (two) times daily.   12 capsule   0   . ibuprofen (ADVIL) 200 MG tablet   Oral   Take 1 tablet (200 mg total) by mouth every 6 (six) hours as needed for mild pain or moderate  pain.   30 tablet   0   . methylergonovine (METHERGINE) 0.2 MG tablet   Oral   Take 1 tablet (0.2 mg total) by mouth 4 (four) times daily.   4 tablet   0     Allergies Review of patient's allergies indicates no known allergies.  History reviewed. No pertinent family history.  Social History Social History  Substance Use Topics  . Smoking status: Never Smoker   . Smokeless tobacco: Never Used  . Alcohol Use: No    Review of Systems  Constitutional: Negative for fever. Eyes: Negative for visual changes. ENT: Negative for sore throat. Cardiovascular: Negative for chest pain. Respiratory: Negative for shortness of breath. Gastrointestinal: Negative for vomiting and diarrhea. Genitourinary: Negative for dysuria. Musculoskeletal: Negative for back pain. Skin: Negative for rash. Neurological: Negative for headache. 10 point Review of Systems otherwise negative ____________________________________________   PHYSICAL EXAM:  VITAL SIGNS: ED Triage Vitals  Enc Vitals Group     BP 05/15/15 2201 89/53 mmHg     Pulse Rate 05/15/15 2201 96     Resp 05/15/15 2201 24     Temp 05/15/15 2201 97.6 F (36.4 C)     Temp Source 05/15/15 2201 Oral     SpO2 05/15/15 2201 96 %     Weight 05/15/15 2201 170 lb (77.111 kg)     Height 05/15/15 2201  (1.6 m)     Head Cir --  Peak Flow --      Pain Score 05/15/15 2202 9     Pain Loc --      Pain Edu? --      Excl. in GC? --      Constitutional: Alert and oriented. Somewhat pale. Eyes: Conjunctivae are normal. PERRL. Normal extraocular movements. ENT   Head: Normocephalic and atraumatic.   Nose: No congestion/rhinnorhea.   Mouth/Throat: Mucous membranes are moist.   Neck: No stridor. Cardiovascular/Chest: Normal rate, regular rhythm.  No murmurs, rubs, or gallops. Respiratory: Normal respiratory effort without tachypnea nor retractions. Breath sounds are clear and equal bilaterally. No  wheezes/rales/rhonchi. Gastrointestinal: Soft. No distention, no guarding, no rebound. Moderate tenderness in the lower abdomen.  Genitourinary/rectal:Deferred Musculoskeletal: Nontender with normal range of motion in all extremities. No joint effusions.  No lower extremity tenderness.  No edema. Neurologic:  Normal speech and language. No gross or focal neurologic deficits are appreciated. Skin:  Skin is warm, dry and intact. No rash noted. Psychiatric: Mood and affect are normal. Speech and behavior are normal. Patient exhibits appropriate insight and judgment.  ____________________________________________   EKG I, Governor Rooks, MD, the attending physician have personally viewed and interpreted all ECGs.  No EKG performed ____________________________________________  LABS (pertinent positives/negatives)  White blood count 10.3 with small left shift. Hemoglobin 1.7, platelet count was 3 Baseline bowel panel significant for potassium 3.3 otherwise negative Beta hCG is pending  ____________________________________________  RADIOLOGY All Xrays were viewed by me. Imaging interpreted by Radiologist.  Ultrasound transvaginal pending __________________________________________  PROCEDURES  Procedure(s) performed: None  Critical Care performed: CRITICAL CARE Performed by: Governor Rooks   Total critical care time: 30 minutes  Critical care time was exclusive of separately billable procedures and treating other patients.  Critical care was necessary to treat or prevent imminent or life-threatening deterioration.  Critical care was time spent personally by me on the following activities: development of treatment plan with patient and/or surrogate as well as nursing, discussions with consultants, evaluation of patient's response to treatment, examination of patient, obtaining history from patient or surrogate, ordering and performing treatments and interventions, ordering and review  of laboratory studies, ordering and review of radiographic studies, pulse oximetry and re-evaluation of patient's condition.   ____________________________________________   ED COURSE / ASSESSMENT AND PLAN  CONSULTATIONS: Face-to-face with Dr. Jean Rosenthal, OB/GYN.  Pertinent labs & imaging results that were available during my care of the patient were reviewed by me and considered in my medical decision making (see chart for details).  Patient arrived having active vaginal bleeding along with abdominal pain and a systolic blood pressure near 90. Patient has been having bleeding associated with a ongoing miscarriage since Sunday, but worsening over the last couple hours. Upon rechecking the blood pressure systolic blood pressures from 956. Patient was treated with a liter of normal saline bolus while awaiting repeat laboratory findings. It's possible her initial hypotension which occurred while she was passing a clot, may have been vagal.  Patient had moderate bright red bleeding with blood clots passed into the toilet with myself at bedside. No tissue visualized.  Hemoglobin is 11.7 and the same as it was yesterday. I discussed the case with Dr. Jean Rosenthal, with OB/GYN, and he did recommend obtaining an ultrasound for evaluation.  Patient care transferred to Dr. Lenard Lance at shift change. Beta hCG, and transvaginal ultrasound are pending. Plan is to talk with Dr. Jean Rosenthal upon these results return.  Patient / Family / Caregiver informed of clinical course, medical decision-making  process, and agree with plan.    ___________________________________________   FINAL CLINICAL IMPRESSION(S) / ED DIAGNOSES   Final diagnoses:  Miscarriage  Vaginal bleeding       Governor Rooks, MD 05/15/15 2317

## 2015-05-15 NOTE — ED Notes (Signed)
Pt reports large amounts of vaginal bleeding secondary to known miscarriage starting @ 1700, has been bleeding x 5 days. Pt's pain has increased and she is somewhat hypotensive in triage w/ additional complaints of feeling as if she were going to faint.

## 2015-05-16 ENCOUNTER — Emergency Department: Payer: Medicaid Other

## 2015-05-16 LAB — HCG, QUANTITATIVE, PREGNANCY: HCG, BETA CHAIN, QUANT, S: 10103 m[IU]/mL — AB (ref <5)

## 2015-05-16 MED ORDER — HYDROCODONE-ACETAMINOPHEN 5-325 MG PO TABS: 1.0000 | ORAL_TABLET | ORAL | Status: DC | PRN

## 2015-05-16 NOTE — ED Notes (Signed)
Pad changed. Approximately 100 ml venous discharge on pad.

## 2015-05-16 NOTE — ED Provider Notes (Signed)
-----------------------------------------   3:03 AM on 05/16/2015 -----------------------------------------  Ultrasound consistent with spontaneous abortion. Labs are largely unchanged. Patient states her pain has decreased considerably. I discussed the patient with Dr. Jean Rosenthal. We'll discharge the patient home with OB follow-up, I would discharge her on pain medication as needed. Patient is agreeable to plan.  Minna Antis, MD 05/16/15 [phone]

## 2015-05-16 NOTE — Discharge Instructions (Signed)
Miscarriage A miscarriage is the sudden loss of an unborn baby (fetus) before the 20th week of pregnancy. Most miscarriages happen in the first 3 months of pregnancy. Sometimes, it happens before a woman even knows she is pregnant. A miscarriage is also called a "spontaneous miscarriage" or "early pregnancy loss." Having a miscarriage can be an emotional experience. Talk with your caregiver about any questions you may have about miscarrying, the grieving process, and your future pregnancy plans. CAUSES   Problems with the fetal chromosomes that make it impossible for the baby to develop normally. Problems with the baby's genes or chromosomes are most often the result of errors that occur, by chance, as the embryo divides and grows. The problems are not inherited from the parents.  Infection of the cervix or uterus.   Hormone problems.   Problems with the cervix, such as having an incompetent cervix. This is when the tissue in the cervix is not strong enough to hold the pregnancy.   Problems with the uterus, such as an abnormally shaped uterus, uterine fibroids, or congenital abnormalities.   Certain medical conditions.   Smoking, drinking alcohol, or taking illegal drugs.   Trauma.  Often, the cause of a miscarriage is unknown.  SYMPTOMS   Vaginal bleeding or spotting, with or without cramps or pain.  Pain or cramping in the abdomen or lower back.  Passing fluid, tissue, or blood clots from the vagina. DIAGNOSIS  Your caregiver will perform a physical exam. You may also have an ultrasound to confirm the miscarriage. Blood or urine tests may also be ordered. TREATMENT   Sometimes, treatment is not necessary if you naturally pass all the fetal tissue that was in the uterus. If some of the fetus or placenta remains in the body (incomplete miscarriage), tissue left behind may become infected and must be removed. Usually, a dilation and curettage (D and C) procedure is performed.  During a D and C procedure, the cervix is widened (dilated) and any remaining fetal or placental tissue is gently removed from the uterus.  Antibiotic medicines are prescribed if there is an infection. Other medicines may be given to reduce the size of the uterus (contract) if there is a lot of bleeding.  If you have Rh negative blood and your baby was Rh positive, you will need a Rh immunoglobulin shot. This shot will protect any future baby from having Rh blood problems in future pregnancies. HOME CARE INSTRUCTIONS   Your caregiver may order bed rest or may allow you to continue light activity. Resume activity as directed by your caregiver.  Have someone help with home and family responsibilities during this time.   Keep track of the number of sanitary pads you use each day and how soaked (saturated) they are. Write down this information.   Do not use tampons. Do not douche or have sexual intercourse until approved by your caregiver.   Only take over-the-counter or prescription medicines for pain or discomfort as directed by your caregiver.   Do not take aspirin. Aspirin can cause bleeding.   Keep all follow-up appointments with your caregiver.   If you or your partner have problems with grieving, talk to your caregiver or seek counseling to help cope with the pregnancy loss. Allow enough time to grieve before trying to get pregnant again.  SEEK IMMEDIATE MEDICAL CARE IF:   You have severe cramps or pain in your back or abdomen.  You have a fever.  You pass large blood clots (walnut-sized   or larger) ortissue from your vagina. Save any tissue for your caregiver to inspect.   Your bleeding increases.   You have a thick, bad-smelling vaginal discharge.  You become lightheaded, weak, or you faint.   You have chills.  MAKE SURE YOU:  Understand these instructions.  Will watch your condition.  Will get help right away if you are not doing well or get  worse. Document Released: 02/22/2001 Document Revised: 12/24/2012 Document Reviewed: 10/18/2011 ExitCare Patient Information 2015 [employer]. This information is not intended to replace advice given to you by your health care provider. Make sure you discuss any questions you have with your health care provider.  

## 2015-05-26 ENCOUNTER — Ambulatory Visit: Payer: Medicaid Other | Admitting: Registered Nurse

## 2015-05-26 ENCOUNTER — Encounter: Payer: Self-pay | Admitting: *Deleted

## 2015-05-26 ENCOUNTER — Other Ambulatory Visit: Payer: Medicaid Other

## 2015-05-26 ENCOUNTER — Ambulatory Visit
Admission: RE | Admit: 2015-05-26 | Discharge: 2015-05-26 | Disposition: A | Discharge status: Home / Self Care | Payer: Medicaid Other | Source: Ambulatory Visit | Attending: Obstetrics and Gynecology | Admitting: Obstetrics and Gynecology

## 2015-05-26 ENCOUNTER — Encounter
Admission: RE | Disposition: A | Discharge status: Home / Self Care | Payer: Self-pay | Source: Ambulatory Visit | Attending: Obstetrics and Gynecology

## 2015-05-26 DIAGNOSIS — O034 Incomplete spontaneous abortion without complication: Secondary | ICD-10-CM | POA: Diagnosis present

## 2015-05-26 DIAGNOSIS — Z9889 Other specified postprocedural states: Secondary | ICD-10-CM | POA: Diagnosis not present

## 2015-05-26 DIAGNOSIS — Z3A08 8 weeks gestation of pregnancy: Secondary | ICD-10-CM | POA: Insufficient documentation

## 2015-05-26 HISTORY — PX: DILATION AND EVACUATION: SHX1459

## 2015-05-26 LAB — TYPE AND SCREEN
ABO/RH(D): A POS
ANTIBODY SCREEN: NEGATIVE

## 2015-05-26 SURGERY — DILATION AND EVACUATION, UTERUS
Anesthesia: General

## 2015-05-26 MED ORDER — PROMETHAZINE HCL 25 MG/ML IJ SOLN
INTRAMUSCULAR | Status: AC
  Filled 2015-05-26: qty 1

## 2015-05-26 MED ORDER — OXYCODONE-ACETAMINOPHEN 5-325 MG PO TABS: 1.0000 | ORAL_TABLET | Freq: Four times a day (QID) | ORAL | Status: DC | PRN

## 2015-05-26 MED ORDER — MIDAZOLAM HCL 2 MG/2ML IJ SOLN
INTRAMUSCULAR | Status: DC | PRN
  Administered 2015-05-26: 2 mg via INTRAVENOUS

## 2015-05-26 MED ORDER — LACTATED RINGERS IV SOLN
INTRAVENOUS | Status: DC
  Administered 2015-05-26: 09:00:00 via INTRAVENOUS

## 2015-05-26 MED ORDER — FENTANYL CITRATE (PF) 100 MCG/2ML IJ SOLN: 25.0000 ug | INTRAMUSCULAR | Status: DC | PRN

## 2015-05-26 MED ORDER — ONDANSETRON HCL 4 MG/2ML IJ SOLN
INTRAMUSCULAR | Status: DC | PRN
  Administered 2015-05-26: 4 mg via INTRAVENOUS

## 2015-05-26 MED ORDER — IBUPROFEN 200 MG PO TABS: 200.0000 mg | ORAL_TABLET | Freq: Four times a day (QID) | ORAL | Status: DC | PRN

## 2015-05-26 MED ORDER — DOXYCYCLINE HYCLATE 100 MG IV SOLR
100.0000 mg | Freq: Once | INTRAVENOUS | Status: AC
  Administered 2015-05-26: 100 mg via INTRAVENOUS
  Filled 2015-05-26: qty 100

## 2015-05-26 MED ORDER — LIDOCAINE HCL (PF) 1 % IJ SOLN
INTRAMUSCULAR | Status: AC
  Filled 2015-05-26: qty 30

## 2015-05-26 MED ORDER — SODIUM CHLORIDE 0.9 % IJ SOLN
INTRAMUSCULAR | Status: AC
  Filled 2015-05-26: qty 10

## 2015-05-26 MED ORDER — FENTANYL CITRATE (PF) 100 MCG/2ML IJ SOLN
INTRAMUSCULAR | Status: DC | PRN
  Administered 2015-05-26: 50 ug via INTRAVENOUS

## 2015-05-26 MED ORDER — PROMETHAZINE HCL 25 MG/ML IJ SOLN
6.2500 mg | INTRAMUSCULAR | Status: DC | PRN
Start: 2015-05-26 — End: 2015-05-26
  Administered 2015-05-26: 12.5 mg via INTRAVENOUS

## 2015-05-26 MED ORDER — LIDOCAINE HCL (CARDIAC) 20 MG/ML IV SOLN
INTRAVENOUS | Status: DC | PRN
  Administered 2015-05-26: 100 mg via INTRAVENOUS

## 2015-05-26 MED ORDER — LIDOCAINE HCL 1 % IJ SOLN
INTRAMUSCULAR | Status: DC | PRN
  Administered 2015-05-26: 20 mL

## 2015-05-26 MED ORDER — DEXAMETHASONE SODIUM PHOSPHATE 4 MG/ML IJ SOLN
INTRAMUSCULAR | Status: DC | PRN
  Administered 2015-05-26: 5 mg via INTRAVENOUS

## 2015-05-26 MED ORDER — PROPOFOL 10 MG/ML IV BOLUS
INTRAVENOUS | Status: DC | PRN
  Administered 2015-05-26: 170 mg via INTRAVENOUS

## 2015-05-26 MED ORDER — GLYCOPYRROLATE 0.2 MG/ML IJ SOLN
INTRAMUSCULAR | Status: DC | PRN
  Administered 2015-05-26: 0.2 mg via INTRAVENOUS

## 2015-05-26 SURGICAL SUPPLY — 20 items
CUP MEDICINE 2OZ PLAST GRAD ST (MISCELLANEOUS) ×2 IMPLANT
GLOVE BIO SURGEON STRL SZ7 (GLOVE) ×8 IMPLANT
GOWN STRL REUS W/ TWL LRG LVL3 (GOWN DISPOSABLE) ×1 IMPLANT
GOWN STRL REUS W/ TWL XL LVL3 (GOWN DISPOSABLE) ×1 IMPLANT
GOWN STRL REUS W/TWL LRG LVL3 (GOWN DISPOSABLE) ×1
GOWN STRL REUS W/TWL XL LVL3 (GOWN DISPOSABLE) ×1
KIT BERKELEY 1ST TRIMESTER 3/8 (MISCELLANEOUS) ×2 IMPLANT
KIT RM TURNOVER CYSTO AR (KITS) ×2 IMPLANT
NEEDLE SPNL 22GX3.5 QUINCKE BK (NEEDLE) ×2 IMPLANT
NS IRRIG 500ML POUR BTL (IV SOLUTION) ×2 IMPLANT
PAD OB MATERNITY 4.3X12.25 (PERSONAL CARE ITEMS) ×2 IMPLANT
PAD PREP 24X41 OB/GYN DISP (PERSONAL CARE ITEMS) ×2 IMPLANT
SET BERKELEY SUCTION TUBING (SUCTIONS) ×2 IMPLANT
SYR CONTROL 10ML (SYRINGE) ×2 IMPLANT
TOWEL OR 17X26 4PK STRL BLUE (TOWEL DISPOSABLE) ×2 IMPLANT
VACURETTE 10 RIGID CVD (CANNULA) IMPLANT
VACURETTE 12 RIGID CVD (CANNULA) IMPLANT
VACURETTE 7MM F TIP (CANNULA)
VACURETTE 7MM F TIP STRL (CANNULA) IMPLANT
VACURETTE 8 RIGID CVD (CANNULA) ×2 IMPLANT

## 2015-05-26 NOTE — Op Note (Signed)
Operative Note   05/26/2015  PRE-OP DIAGNOSIS: incomplete abortion, crown rump length 9mm, embryonic demise   POST-OP DIAGNOSIS: Same  SURGEON: Surgeon(s) and Role:    * [place] Bing, MD - Primary  ASSISTANT: none  PROCEDURE:  Suction dilation and curettage  ANESTHESIA: Monitor Anesthesia Care and paracervical block  ESTIMATED BLOOD LOSS: 10mL  DRAINS: I/O cath 25mL UOP  TOTAL IV FLUIDS: crystalloid  SPECIMENS: products of conception to pathology  VTE PROPHYLAXIS: SCDs to the bilateral lower extremities  ANTIBIOTICS: Doxycycline  IV x 1 pre op  COMPLICATIONS: none  DISPOSITION: PACU - hemodynamically stable.  CONDITION: stable  BLOOD TYPE: A POS  FINDINGS: Exam under anesthesia revealed 8 week sized uterus with no masses and bilateral adnexa without masses or fullness. Necrotic appearing products of conception were seen, with gritty texture in all four quadrants at the end of the procedure.   PROCEDURE IN DETAIL:  After informed consent was obtained, the patient was taken to the operating room where anesthesia was obtained without difficulty. The patient was positioned in the dorsal lithotomy position in [place] stirrups. The patient was examined under anesthesia, with the above noted findings.  The bi-valved speculum was placed inside the patient's vagina, and the the anterior lip of the cervix was seen and grasped with the tenaculum.  A paracervical block was achieved with 20mL of 1% lidocaine. The suction was then calibrated to and connected to the number 7 cannula passed easily without need for dilation, which was then introduced with the above noted findings. A gentle curettage was done, with no products of conception and gritty texture in all four quadrant.   The suction was then done one more time to remove the curettage maternal  Excellent hemostasis was noted, and all instruments were removed, with excellent hemostasis noted throughout.  She was then  taken out of dorsal lithotomy. The patient tolerated the procedure well. Counts were correct.  The patient was taken to recovery room in excellent condition.  Cornelia Copa MD [facility]  Pager: [phone]

## 2015-05-26 NOTE — Discharge Instructions (Addendum)
° ° °We will discuss your surgery once again in detail at your post-op visit in two to four weeks. If you haven’t already done so, please call to make your appointment as soon as possible. ° °[facility] [place]  °[address]  °[place], [place] [address] [place], [place] [address]  °Phone: [phone] Phone: [phone]  °Fax: [phone] Fax: [phone]  ° °Dilation and Curettage or Vacuum Curettage, Care After °These instructions give you information on caring for yourself after your procedure. Your doctor may also give you more specific instructions. Call your doctor if you have any problems or questions after your procedure. °HOME CARE °· Do not drive for 24 hours. °· Wait 1 week before doing any activities that wear you out. °· Do not stand for a long time. °· Limit stair climbing to once or twice a day. °· Rest often. °· Continue with your usual diet. °· Drink enough fluids to keep your pee (urine) clear or pale yellow. °· If you have a hard time pooping (constipation), you may: °¨ Take a medicine to help you go poop (laxative) as told by your doctor. °¨ Eat more fruit and bran. °¨ Drink more fluids. °· Take showers, not baths, for as long as told by your doctor. °· Do not swim or use a hot tub until your doctor says it is okay. °· Have someone with you for 1day after the procedure. °· Do not douche, use tampons, or have sex (intercourse) until seen by your doctor °· Only take medicines as told by your doctor. Do not take aspirin. It can cause bleeding. °· Keep all doctor visits. °GET HELP IF: °· You have cramps or pain not helped by medicine. °· You have new pain in the belly (abdomen). °· You have a bad smelling fluid coming from your vagina. °· You have a rash. °· You have problems with any medicine. °GET HELP RIGHT AWAY IF:  °· You start to bleed more than a regular period. °· You have a fever. °· You have chest pain. °· You have trouble breathing. °· You feel dizzy or feel like  passing out (fainting). °· You pass out. °· You have pain in the tops of your shoulders. °· You have vaginal bleeding with or without clumps of blood (blood clots). °MAKE SURE YOU: °· Understand these instructions. °· Will watch your condition. °· Will get help right away if you are not doing well or get worse. °Document Released: 06/07/2008 Document Revised: 09/03/2013 Document Reviewed: 03/28/2013 °ExitCare® Patient Information ©2015 [employer]. This information is not intended to replace advice given to you by your health care provider. Make sure you discuss any questions you have with your health care provider. ° ° ° ° ° °AMBULATORY SURGERY  °DISCHARGE INSTRUCTIONS ° ° °1) The drugs that you were given will stay in your system until tomorrow so for the next 24 hours you should not: ° °A) Drive an automobile °B) Make any legal decisions °C) Drink any alcoholic beverage ° ° °2) You may resume regular meals tomorrow.  Today it is better to start with liquids and gradually work up to solid foods. ° °You may eat anything you prefer, but it is better to start with liquids, then soup and crackers, and gradually work up to solid foods. ° ° °3) Please notify your doctor immediately if you have any unusual bleeding, trouble breathing, redness and pain at the surgery site, drainage, fever, or pain not relieved by medication. °4)  ° °  5) Your post-operative visit with Dr.                     °           °     is: Date:                        Time:   ° °Please call to schedule your post-operative visit. ° °6) Additional Instructions: °7)  °

## 2015-05-26 NOTE — Transfer of Care (Signed)
Immediate Anesthesia Transfer of Care Note  Patient: Amber Duffy  Procedure(s) Performed: Procedure(s): DILATATION AND EVACUATION (N/A)  Patient Location: PACU  Anesthesia Type:General  Level of Consciousness: sedated  Airway & Oxygen Therapy: Patient Spontanous Breathing and Patient connected to face mask oxygen  Post-op Assessment: Report given to RN and Post -op Vital signs reviewed and stable  Post vital signs: Reviewed and stable  Last Vitals:  Filed Vitals:   05/26/15 1012  BP: 100/76  Pulse: 79  Temp: 36.4 C  Resp: 18    Complications: No apparent anesthesia complications

## 2015-05-26 NOTE — Anesthesia Procedure Notes (Signed)
Procedure Name: LMA Insertion Date/Time: 05/26/2015 9:36 AM Performed by: Stormy Fabian Pre-anesthesia Checklist: Patient identified, Patient being monitored, Timeout performed, Emergency Drugs available and Suction available Patient Re-evaluated:Patient Re-evaluated prior to inductionOxygen Delivery Method: Circle system utilized Preoxygenation: Pre-oxygenation with 100% oxygen Intubation Type: IV induction Ventilation: Mask ventilation without difficulty LMA: LMA inserted LMA Size: 3.5 Tube type: Oral Number of attempts: 1 Placement Confirmation: positive ETCO2 and breath sounds checked- equal and bilateral Tube secured with: Tape Dental Injury: Teeth and Oropharynx as per pre-operative assessment

## 2015-05-26 NOTE — H&P (Addendum)
GYN H&P No changes from office H&P. Small amount of old blood and spots but no passage of clots or tissue since seen in office yesterday. Can proceed when OR is ready.  Cornelia Copa MD [facility]  Pager: [phone]

## 2015-05-26 NOTE — Anesthesia Preprocedure Evaluation (Signed)
Anesthesia Evaluation  Patient identified by MRN, date of birth, ID band Patient awake    Reviewed: Allergy & Precautions, H&P , NPO status , Patient's Chart, lab work & pertinent test results, reviewed documented beta blocker date and time   Airway Mallampati: I  TM Distance: >3 FB Neck ROM: full    Dental no notable dental hx. (+) Teeth Intact   Pulmonary neg pulmonary ROS,    Pulmonary exam normal breath sounds clear to auscultation       Cardiovascular Exercise Tolerance: Good negative cardio ROS Normal cardiovascular exam Rhythm:regular Rate:Normal     Neuro/Psych negative neurological ROS  negative psych ROS   GI/Hepatic negative GI ROS, Neg liver ROS,   Endo/Other  negative endocrine ROS  Renal/GU negative Renal ROS  negative genitourinary   Musculoskeletal   Abdominal   Peds  Hematology negative hematology ROS (+)   Anesthesia Other Findings History reviewed. No pertinent past medical history.   Reproductive/Obstetrics negative OB ROS                             Anesthesia Physical Anesthesia Plan  ASA: I  Anesthesia Plan: General   Post-op Pain Management:    Induction:   Airway Management Planned:   Additional Equipment:   Intra-op Plan:   Post-operative Plan:   Informed Consent: I have reviewed the patients History and Physical, chart, labs and discussed the procedure including the risks, benefits and alternatives for the proposed anesthesia with the patient or authorized representative who has indicated his/her understanding and acceptance.   Dental Advisory Given  Plan Discussed with: Anesthesiologist, CRNA and Surgeon  Anesthesia Plan Comments:         Anesthesia Quick Evaluation  

## 2015-05-27 LAB — SURGICAL PATHOLOGY

## 2015-05-28 NOTE — Anesthesia Postprocedure Evaluation (Signed)
  Anesthesia Post-op Note  Patient: Amber Duffy  Procedure(s) Performed: Procedure(s): DILATATION AND EVACUATION (N/A)  Anesthesia type:General  Patient location: PACU  Post pain: Pain level controlled  Post assessment: Post-op Vital signs reviewed, Patient's Cardiovascular Status Stable, Respiratory Function Stable, Patent Airway and No signs of Nausea or vomiting  Post vital signs: Reviewed and stable  Last Vitals:  Filed Vitals:   05/26/15 1229  BP: 93/60  Pulse: 86  Temp: 36.3 C  Resp: 16    Level of consciousness: awake, alert  and patient cooperative  Complications: No apparent anesthesia complications

## 2016-05-31 LAB — OB RESULTS CONSOLE RUBELLA ANTIBODY, IGM: Rubella: IMMUNE

## 2016-05-31 LAB — OB RESULTS CONSOLE HEPATITIS B SURFACE ANTIGEN: Hepatitis B Surface Ag: NEGATIVE

## 2016-05-31 LAB — OB RESULTS CONSOLE GC/CHLAMYDIA
CHLAMYDIA, DNA PROBE: NEGATIVE
Gonorrhea: NEGATIVE

## 2016-05-31 LAB — OB RESULTS CONSOLE RPR: RPR: NONREACTIVE

## 2016-05-31 LAB — OB RESULTS CONSOLE VARICELLA ZOSTER ANTIBODY, IGG: Varicella: NON-IMMUNE/NOT IMMUNE

## 2016-05-31 LAB — OB RESULTS CONSOLE HIV ANTIBODY (ROUTINE TESTING): HIV: NONREACTIVE

## 2016-08-23 ENCOUNTER — Encounter: Payer: Self-pay | Admitting: *Deleted

## 2016-08-23 ENCOUNTER — Observation Stay
Admission: EM | Admit: 2016-08-23 | Discharge: 2016-08-23 | Disposition: A | Discharge status: Home / Self Care | Payer: Medicaid Other | Attending: Obstetrics and Gynecology | Admitting: Obstetrics and Gynecology

## 2016-08-23 DIAGNOSIS — Z3A21 21 weeks gestation of pregnancy: Secondary | ICD-10-CM | POA: Insufficient documentation

## 2016-08-23 DIAGNOSIS — N760 Acute vaginitis: Secondary | ICD-10-CM | POA: Insufficient documentation

## 2016-08-23 DIAGNOSIS — R1030 Lower abdominal pain, unspecified: Secondary | ICD-10-CM | POA: Insufficient documentation

## 2016-08-23 DIAGNOSIS — O26892 Other specified pregnancy related conditions, second trimester: Secondary | ICD-10-CM | POA: Diagnosis not present

## 2016-08-23 DIAGNOSIS — R109 Unspecified abdominal pain: Secondary | ICD-10-CM | POA: Diagnosis present

## 2016-08-23 LAB — URINALYSIS, ROUTINE W REFLEX MICROSCOPIC
BACTERIA UA: NONE SEEN
Bilirubin Urine: NEGATIVE
Glucose, UA: NEGATIVE mg/dL
Hgb urine dipstick: NEGATIVE
Ketones, ur: NEGATIVE mg/dL
Leukocytes, UA: NEGATIVE
Nitrite: NEGATIVE
Protein, ur: NEGATIVE mg/dL
SPECIFIC GRAVITY, URINE: 1.017 (ref 1.005–1.030)
pH: 6 (ref 5.0–8.0)

## 2016-08-23 LAB — CHLAMYDIA/NGC RT PCR (ARMC ONLY)
Chlamydia Tr: NOT DETECTED
N gonorrhoeae: NOT DETECTED

## 2016-08-23 LAB — WET PREP, GENITAL
SPERM: NONE SEEN
TRICH WET PREP: NONE SEEN
YEAST WET PREP: NONE SEEN

## 2016-08-23 MED ORDER — METRONIDAZOLE 500 MG PO TABS: 500.0000 mg | ORAL_TABLET | Freq: Two times a day (BID) | ORAL | 0 refills | Status: AC

## 2016-08-23 NOTE — Final Progress Note (Signed)
Physician Final Progress Note  Patient ID: Amber Duffy MRN: [id number] DOB/AGE: 1991/03/20 25 y.o.  Admit date: 08/23/2016 Admitting provider: Conard NovakStephen D Griffin Gerrard, MD Discharge date: 08/23/2016   Admission Diagnoses:  1) abdominal pain, lower abdomen 2) vaginal discharge  Discharge Diagnoses:  1) abdominal pain, lower abdomen 2) bacterial vaginosis   History of Present Illness: The patient is a 25 y.o. female [phone] at 6041w2d who presents for evaluation of acute-onset abdominal pain that started today while at work.  It is described as sharp, non-radiating.  Is rated as moderate.  Denies alleviating and aggravating factors. Associated symptoms include vaginal discharge that is malodorous and thin.  Denies vaginal bleeding. Notes good fetal movement.   Past Medical History:  Diagnosis Date  . Medical history non-contributory     Past Surgical History:  Procedure Laterality Date  . DILATION AND CURETTAGE OF UTERUS    . DILATION AND EVACUATION N/A 02/26/2015   Procedure: DILATATION AND EVACUATION;  Surgeon: Nadara Mustardobert P Harris, MD;  Location: [facility] ORS;  Service: Gynecology;  Laterality: N/A;  . DILATION AND EVACUATION N/A 05/26/2015   Procedure: DILATATION AND EVACUATION;  Surgeon: Huxley Bingharlie Pickens, MD;  Location: [facility] ORS;  Service: Gynecology;  Laterality: N/A;    No current facility-administered medications on file prior to encounter.    Current Outpatient Prescriptions on File Prior to Encounter  Medication Sig Dispense Refill  . HYDROcodone-acetaminophen (NORCO/VICODIN) 5-325 MG per tablet Take 1 tablet by mouth every 4 (four) hours as needed for moderate pain. 20 tablet 0  . ibuprofen (ADVIL) 200 MG tablet Take 1 tablet (200 mg total) by mouth every 6 (six) hours as needed for mild pain or moderate pain. 30 tablet 0  . ibuprofen (MOTRIN IB) 200 MG tablet Take 1 tablet (200 mg total) by mouth every 6 (six) hours as needed. 50 tablet 0  . oxyCODONE-acetaminophen (ROXICET) 5-325 MG  per tablet Take 1 tablet by mouth every 6 (six) hours as needed for severe pain. 4 tablet 0   Allergies: No Known Allergies  Social History   Social History  . Marital status: Single    Spouse name: N/A  . Number of children: N/A  . Years of education: N/A   Occupational History  . Not on file.   Social History Main Topics  . Smoking status: Never Smoker  . Smokeless tobacco: Never Used  . Alcohol use No  . Drug use: No  . Sexual activity: Yes   Other Topics Concern  . Not on file   Social History Narrative  . No narrative on file    Physical Exam: BP (!) 99/57 (BP Location: Left Arm)   Pulse 81   Temp 97.9 F (36.6 C) (Oral)   Resp 18   Ht 5\' 3"  (1.6 m)   Wt 190 lb (86.2 kg)   BMI 33.66 kg/m   Gen: NAD CV: RRR Pulm: CTAB Pelvic: NEFG, no pooling, cervix appears closed.  Uterus is non-tender.   Cvx: closed, thick, high Ext: no e/c/t  Consults: None  Significant Findings/ Diagnostic Studies:  Lab Results  Component Value Date   TRICHWETPREP NONE SEEN 08/23/2016   CLUECELLS PRESENT (A) 08/23/2016   WBCWETPREP MANY (A) 08/23/2016   YEASTWETPREP NONE SEEN 08/23/2016    Lab Results  Component Value Date   APPEARANCEUR CLEAR (A) 08/23/2016   GLUCOSEU NEGATIVE 08/23/2016   BILIRUBINUR NEGATIVE 08/23/2016   KETONESUR NEGATIVE 08/23/2016   LABSPEC 1.017 08/23/2016   HGBUR NEGATIVE 08/23/2016   PHURINE 6.0  08/23/2016   NITRITE NEGATIVE 08/23/2016   LEUKOCYTESUR NEGATIVE 08/23/2016   RBCU 0-5 08/23/2016   WBCU 0-5 08/23/2016   BACTERIA NONE SEEN 08/23/2016   EPIU 0-5 (A) 08/23/2016   MUCOUSUACOMP PRESENT 08/23/2016    Lab Results  Component Value Date   CHLAMYDIA NOT DETECTED 08/23/2016   NGONORRHOEAE NOT DETECTED 08/23/2016     Procedures: Normal fetal heart tones  Discharge Condition: stable  Disposition: 01-Home or Self Care  Diet: Regular diet  Discharge Activity: Activity as tolerated     Medication List    STOP taking these  medications   HYDROcodone-acetaminophen 5-325 MG tablet Commonly known as:  NORCO/VICODIN   ibuprofen 200 MG tablet Commonly known as:  MOTRIN IB   oxyCODONE-acetaminophen 5-325 MG tablet Commonly known as:  ROXICET     TAKE these medications   metroNIDAZOLE 500 MG tablet Commonly known as:  FLAGYL Take 1 tablet (500 mg total) by mouth 2 (two) times daily.      Follow-up Information    [facility] 2 week(s).   Why:  routine OB appointment or as needed Contact information: [address] [place] [address] [phone]           Total time spent taking care of this patient: 30 minutes  Signed: Conard NovakJackson, Lynda Capistran D, MD  08/23/2016, 4:54 PM

## 2016-08-23 NOTE — OB Triage Note (Signed)
Recvd to OBS2 with complaints of leaking fluid at [redacted] weeks gestation.  Changed to gown and to bed.  EFM applied.   Oriented to room and plan of care.

## 2016-08-23 NOTE — Progress Notes (Signed)
EFM removed.  TOCO continues.

## 2016-08-23 NOTE — Discharge Instructions (Signed)
Drink plenty of fluid and get plenty rest. Take all prescriptions as prescribed and call your provider for any other concerns.

## 2016-08-23 NOTE — Progress Notes (Signed)
Discharge instructions given and reviewed.  Questions answered.  Verbalized understanding.  Prescription given.

## 2016-11-22 ENCOUNTER — Ambulatory Visit (INDEPENDENT_AMBULATORY_CARE_PROVIDER_SITE_OTHER): Payer: Medicaid Other | Admitting: Obstetrics & Gynecology

## 2016-11-22 VITALS — BP 100/70 | Wt 201.0 lb

## 2016-11-22 DIAGNOSIS — Z3490 Encounter for supervision of normal pregnancy, unspecified, unspecified trimester: Secondary | ICD-10-CM

## 2016-11-22 DIAGNOSIS — Z3A34 34 weeks gestation of pregnancy: Secondary | ICD-10-CM

## 2016-11-22 DIAGNOSIS — O0993 Supervision of high risk pregnancy, unspecified, third trimester: Secondary | ICD-10-CM | POA: Insufficient documentation

## 2016-11-22 NOTE — Progress Notes (Signed)
Having a lot of cramps and pelvic pressure.  Exam neg for nitrazine or pooling and no signs of dilation. PNV, FMC, PTL precautions, GBS nv. Plans IUD or Nexplanon, Breast feeding

## 2016-12-06 ENCOUNTER — Observation Stay
Admission: EM | Admit: 2016-12-06 | Discharge: 2016-12-06 | Disposition: A | Discharge status: Home / Self Care | Payer: Medicaid Other | Attending: Obstetrics & Gynecology | Admitting: Obstetrics & Gynecology

## 2016-12-06 DIAGNOSIS — O26893 Other specified pregnancy related conditions, third trimester: Secondary | ICD-10-CM | POA: Diagnosis not present

## 2016-12-06 DIAGNOSIS — R109 Unspecified abdominal pain: Secondary | ICD-10-CM | POA: Insufficient documentation

## 2016-12-06 DIAGNOSIS — Z3A36 36 weeks gestation of pregnancy: Secondary | ICD-10-CM | POA: Insufficient documentation

## 2016-12-06 DIAGNOSIS — O26899 Other specified pregnancy related conditions, unspecified trimester: Secondary | ICD-10-CM | POA: Diagnosis present

## 2016-12-06 NOTE — Discharge Summary (Signed)
  See FPN 

## 2016-12-06 NOTE — OB Triage Note (Signed)
Pt arrived to obs rm 4 with c/o contractions starting Saturday night/Sunday morning with pain rating 7-8/10 in pelvic area and back. Pt stating irregular contractions over the weekend but the pelvic pressure with the contractions are what brought her in today. Pt stated she was resting over the weekend. No recent sex, no LOF, or discharge or bleeding. Small amount of urine leakage for the past month. Pt placed on monitor and oriented to room

## 2016-12-06 NOTE — Discharge Instructions (Signed)
Discharge instructions given, patient stated understanding. Labor precautions given.  °

## 2016-12-06 NOTE — Progress Notes (Signed)
[facility] [id number] [place] [address] Phone: [phone] Fax: [phone]  December 06, 2016  Patient: Trisha MangleSonia Zukowski  Date of Birth: 04-30-1991  Date of Visit: 12/06/2016    To Whom It May Concern:  Trisha MangleSonia Fluegel and her Husband was seen and treated in our [facility] on 12/06/2016. Trisha MangleSonia Runyon and her husband are excused from work today, and may return to work on 12/07/16.  Sincerely,  Annamarie MajorPaul Yoandri Congrove, MD [facility]

## 2016-12-06 NOTE — Final Progress Note (Addendum)
Physician Final Progress Note  Patient ID: Amber MangleSonia Hensarling MRN: [id number] DOB/AGE: 08-Nov-1990 26 y.o.  Admit date: 12/06/2016 Admitting provider: Nadara Mustardobert P Autry Droege, MD Discharge date: 12/06/2016  Admission Diagnoses: Abdominal Pain  Discharge Diagnoses:  Principal Problem:   Abdominal pain affecting pregnancy    Consults: None  Significant Findings/ Diagnostic Studies: Pt is a 26 yo G5P1031 WF at 36 weeks with c/o pelvic pressure and lower abd pains associated with her gravid uterus; no ROM or VB.  Has been seen off and on for these concerns over the past 2 weeks.  Prior term NSVD, also prior fetal loss at 20 weeks.     PMHx: She  has a past medical history of Medical history non-contributory. Also,  has a past surgical history that includes Dilation and curettage of uterus; Dilation and evacuation (N/A, 02/26/2015); and Dilation and evacuation (N/A, 05/26/2015)., family history is neg for cancer or birth defects,  reports that she has never smoked. She has never used smokeless tobacco. She reports that she does not drink alcohol or use drugs.  She takes prenatals;  Also, has No Known Allergies.  Review of Systems  Constitutional: Negative for chills, fever and malaise/fatigue.  HENT: Negative for congestion, sinus pain and sore throat.   Eyes: Negative for blurred vision and pain.  Respiratory: Negative for cough and wheezing.   Cardiovascular: Negative for chest pain and leg swelling.  Gastrointestinal: Negative for abdominal pain, constipation, diarrhea, heartburn, nausea and vomiting.  Genitourinary: Negative for dysuria, frequency, hematuria and urgency.  Musculoskeletal: Negative for back pain, joint pain, myalgias and neck pain.  Skin: Negative for itching and rash.  Neurological: Negative for dizziness, tremors and weakness.  Endo/Heme/Allergies: Does not bruise/bleed easily.  Psychiatric/Behavioral: Negative for depression. The patient is not nervous/anxious and does not have  insomnia.   Physical Exam  Constitutional: She is oriented to person, place, and time. She appears well-developed and well-nourished. No distress.  Genitourinary: Vagina normal. Pelvic exam was performed with patient supine. There is no rash, tenderness or lesion on the right labia. There is no rash, tenderness or lesion on the left labia. No erythema or bleeding in the vagina.  Abdominal: Soft. She exhibits no distension. There is no tenderness.  Musculoskeletal: Normal range of motion.  Neurological: She is alert and oriented to person, place, and time. No cranial nerve deficit.  Skin: Skin is warm and dry.  Psychiatric: She has a normal mood and affect.  No change in cervical exam on 2 occasions- Cervix FT -1 (int os FT), 40%), High station; Vtx  Procedures: A NST procedure was performed with FHR monitoring and a normal baseline established, appropriate time of 20-40 minutes of evaluation, and accels >2 seen w 15x15 characteristics.  Results show a REACTIVE NST.   Discharge Condition: good  Disposition: 01-Home or Self Care  Diet: Regular diet  Discharge Activity: Activity as tolerated   Allergies as of 12/06/2016   No Known Allergies     Medication List    TAKE these medications   ferrous sulfate 325 (65 FE) MG EC tablet Take 325 mg by mouth 3 (three) times daily with meals.   multivitamin-prenatal 27-0.8 MG Tabs tablet Take 1 tablet by mouth daily at 12 noon.      Follow-up Information    [facility].   Why:  Please go to your routine OB appointment  Contact information: [address] [place] [phone]  Total time spent taking care of this patient: 30 minutes  Signed: Letitia Libra 12/06/2016, 3:08 PM

## 2016-12-08 ENCOUNTER — Ambulatory Visit (INDEPENDENT_AMBULATORY_CARE_PROVIDER_SITE_OTHER): Payer: Medicaid Other | Admitting: Advanced Practice Midwife

## 2016-12-08 VITALS — BP 102/66 | Wt 206.0 lb

## 2016-12-08 DIAGNOSIS — Z3685 Encounter for antenatal screening for Streptococcus B: Secondary | ICD-10-CM

## 2016-12-08 DIAGNOSIS — Z113 Encounter for screening for infections with a predominantly sexual mode of transmission: Secondary | ICD-10-CM

## 2016-12-08 DIAGNOSIS — Z3A36 36 weeks gestation of pregnancy: Secondary | ICD-10-CM

## 2016-12-08 NOTE — Progress Notes (Signed)
GBS/Aptima today 

## 2016-12-08 NOTE — Progress Notes (Signed)
Patient says she has hourly contractions most days. Reviewed labor precautions, FKCs.

## 2016-12-10 LAB — STREP GP B NAA: STREP GROUP B AG: NEGATIVE

## 2016-12-10 LAB — GC/CHLAMYDIA PROBE AMP
Chlamydia trachomatis, NAA: NEGATIVE
Neisseria gonorrhoeae by PCR: NEGATIVE

## 2016-12-14 ENCOUNTER — Ambulatory Visit (INDEPENDENT_AMBULATORY_CARE_PROVIDER_SITE_OTHER): Payer: Medicaid Other | Admitting: Obstetrics and Gynecology

## 2016-12-14 VITALS — BP 108/68 | Wt 210.0 lb

## 2016-12-14 DIAGNOSIS — O99213 Obesity complicating pregnancy, third trimester: Secondary | ICD-10-CM

## 2016-12-14 DIAGNOSIS — Z3A38 38 weeks gestation of pregnancy: Secondary | ICD-10-CM

## 2016-12-14 DIAGNOSIS — O0993 Supervision of high risk pregnancy, unspecified, third trimester: Secondary | ICD-10-CM

## 2016-12-14 DIAGNOSIS — Z6833 Body mass index (BMI) 33.0-33.9, adult: Secondary | ICD-10-CM

## 2016-12-14 NOTE — Progress Notes (Signed)
No vb. No lof.  

## 2016-12-22 ENCOUNTER — Ambulatory Visit (INDEPENDENT_AMBULATORY_CARE_PROVIDER_SITE_OTHER): Payer: Medicaid Other | Admitting: Advanced Practice Midwife

## 2016-12-22 VITALS — BP 114/78 | Wt 210.0 lb

## 2016-12-22 DIAGNOSIS — D509 Iron deficiency anemia, unspecified: Secondary | ICD-10-CM

## 2016-12-22 DIAGNOSIS — Z3A38 38 weeks gestation of pregnancy: Secondary | ICD-10-CM

## 2016-12-22 NOTE — Progress Notes (Signed)
Having some strong contractions especially at night. Nothing consistently regular. Labor precautions reviewed. H/H checked today due to low hgb at 28 weeks. Pt has been taking her Fe supplement.

## 2016-12-23 LAB — HEMATOCRIT: Hematocrit: 30.5 % — ABNORMAL LOW (ref 34.0–46.6)

## 2016-12-23 LAB — HEMOGLOBIN: HEMOGLOBIN: 9.8 g/dL — AB (ref 11.1–15.9)

## 2016-12-26 ENCOUNTER — Inpatient Hospital Stay: Payer: Medicaid Other | Admitting: Certified Registered Nurse Anesthetist

## 2016-12-26 ENCOUNTER — Inpatient Hospital Stay
Admission: EM | Admit: 2016-12-26 | Discharge: 2016-12-29 | DRG: 765 | Disposition: A | Discharge status: Home / Self Care | Payer: Medicaid Other | Attending: Certified Nurse Midwife | Admitting: Certified Nurse Midwife

## 2016-12-26 ENCOUNTER — Encounter
Admission: EM | Disposition: A | Discharge status: Home / Self Care | Payer: Self-pay | Source: Home / Self Care | Attending: Certified Nurse Midwife

## 2016-12-26 DIAGNOSIS — O9081 Anemia of the puerperium: Secondary | ICD-10-CM | POA: Diagnosis not present

## 2016-12-26 DIAGNOSIS — R109 Unspecified abdominal pain: Secondary | ICD-10-CM

## 2016-12-26 DIAGNOSIS — O99214 Obesity complicating childbirth: Secondary | ICD-10-CM | POA: Diagnosis present

## 2016-12-26 DIAGNOSIS — E669 Obesity, unspecified: Secondary | ICD-10-CM | POA: Diagnosis present

## 2016-12-26 DIAGNOSIS — Z3A39 39 weeks gestation of pregnancy: Secondary | ICD-10-CM

## 2016-12-26 DIAGNOSIS — O26899 Other specified pregnancy related conditions, unspecified trimester: Secondary | ICD-10-CM

## 2016-12-26 DIAGNOSIS — Z3493 Encounter for supervision of normal pregnancy, unspecified, third trimester: Secondary | ICD-10-CM | POA: Diagnosis present

## 2016-12-26 DIAGNOSIS — O0993 Supervision of high risk pregnancy, unspecified, third trimester: Secondary | ICD-10-CM

## 2016-12-26 DIAGNOSIS — O09899 Supervision of other high risk pregnancies, unspecified trimester: Secondary | ICD-10-CM

## 2016-12-26 DIAGNOSIS — Z98891 History of uterine scar from previous surgery: Secondary | ICD-10-CM

## 2016-12-26 DIAGNOSIS — Z6833 Body mass index (BMI) 33.0-33.9, adult: Secondary | ICD-10-CM

## 2016-12-26 DIAGNOSIS — O99213 Obesity complicating pregnancy, third trimester: Secondary | ICD-10-CM

## 2016-12-26 DIAGNOSIS — Z6837 Body mass index (BMI) 37.0-37.9, adult: Secondary | ICD-10-CM

## 2016-12-26 DIAGNOSIS — Z283 Underimmunization status: Secondary | ICD-10-CM

## 2016-12-26 DIAGNOSIS — O324XX0 Maternal care for high head at term, not applicable or unspecified: Secondary | ICD-10-CM | POA: Diagnosis present

## 2016-12-26 DIAGNOSIS — D62 Acute posthemorrhagic anemia: Secondary | ICD-10-CM | POA: Diagnosis not present

## 2016-12-26 HISTORY — DX: Obesity, unspecified: E66.9

## 2016-12-26 LAB — CBC
HCT: 30.9 % — ABNORMAL LOW (ref 35.0–47.0)
Hemoglobin: 10.2 g/dL — ABNORMAL LOW (ref 12.0–16.0)
MCH: 24.4 pg — ABNORMAL LOW (ref 26.0–34.0)
MCHC: 33.2 g/dL (ref 32.0–36.0)
MCV: 73.5 fL — ABNORMAL LOW (ref 80.0–100.0)
PLATELETS: 131 10*3/uL — AB (ref 150–440)
RBC: 4.2 MIL/uL (ref 3.80–5.20)
RDW: 18.3 % — AB (ref 11.5–14.5)
WBC: 9.8 10*3/uL (ref 3.6–11.0)

## 2016-12-26 LAB — TYPE AND SCREEN
ABO/RH(D): A POS
ANTIBODY SCREEN: NEGATIVE

## 2016-12-26 SURGERY — Surgical Case
Anesthesia: Choice | Wound class: Clean Contaminated

## 2016-12-26 MED ORDER — IBUPROFEN 600 MG PO TABS
600.0000 mg | ORAL_TABLET | Freq: Four times a day (QID) | ORAL | Status: DC
  Administered 2016-12-26 – 2016-12-29 (×10): 600 mg via ORAL
  Filled 2016-12-26 (×10): qty 1

## 2016-12-26 MED ORDER — NALBUPHINE HCL 10 MG/ML IJ SOLN: 5.0000 mg | INTRAMUSCULAR | Status: DC | PRN

## 2016-12-26 MED ORDER — NALBUPHINE HCL 10 MG/ML IJ SOLN: 5.0000 mg | Freq: Once | INTRAMUSCULAR | Status: DC | PRN

## 2016-12-26 MED ORDER — BUPIVACAINE IN DEXTROSE 0.75-8.25 % IT SOLN
INTRATHECAL | Status: DC | PRN
  Administered 2016-12-26: 1.6 mL via INTRATHECAL

## 2016-12-26 MED ORDER — ONDANSETRON HCL 4 MG/2ML IJ SOLN
4.0000 mg | Freq: Four times a day (QID) | INTRAMUSCULAR | Status: DC | PRN
  Administered 2016-12-26: 4 mg via INTRAVENOUS
  Filled 2016-12-26: qty 2

## 2016-12-26 MED ORDER — SIMETHICONE 80 MG PO CHEW
80.0000 mg | CHEWABLE_TABLET | ORAL | Status: DC
  Administered 2016-12-26 – 2016-12-29 (×3): 80 mg via ORAL
  Filled 2016-12-26 (×3): qty 1

## 2016-12-26 MED ORDER — DIBUCAINE 1 % RE OINT: 1.0000 "application " | TOPICAL_OINTMENT | RECTAL | Status: DC | PRN

## 2016-12-26 MED ORDER — NALOXONE HCL 0.4 MG/ML IJ SOLN: 0.4000 mg | INTRAMUSCULAR | Status: DC | PRN

## 2016-12-26 MED ORDER — LIDOCAINE HCL (PF) 1 % IJ SOLN
INTRAMUSCULAR | Status: AC
  Filled 2016-12-26: qty 30

## 2016-12-26 MED ORDER — ACETAMINOPHEN 325 MG PO TABS
650.0000 mg | ORAL_TABLET | ORAL | Status: DC | PRN
  Administered 2016-12-28: 650 mg via ORAL
  Filled 2016-12-26: qty 2

## 2016-12-26 MED ORDER — DIPHENHYDRAMINE HCL 25 MG PO CAPS: 25.0000 mg | ORAL_CAPSULE | ORAL | Status: DC | PRN

## 2016-12-26 MED ORDER — OXYTOCIN 40 UNITS IN LACTATED RINGERS INFUSION - SIMPLE MED
2.5000 [IU]/h | INTRAVENOUS | Status: AC
  Administered 2016-12-26: 2.5 [IU]/h via INTRAVENOUS
  Filled 2016-12-26: qty 1000

## 2016-12-26 MED ORDER — OXYTOCIN BOLUS FROM INFUSION: 500.0000 mL | Freq: Once | INTRAVENOUS | Status: DC

## 2016-12-26 MED ORDER — MEPERIDINE HCL 25 MG/ML IJ SOLN: 6.2500 mg | INTRAMUSCULAR | Status: DC | PRN

## 2016-12-26 MED ORDER — LACTATED RINGERS IV SOLN: 500.0000 mL | INTRAVENOUS | Status: DC | PRN

## 2016-12-26 MED ORDER — SENNOSIDES-DOCUSATE SODIUM 8.6-50 MG PO TABS
2.0000 | ORAL_TABLET | ORAL | Status: DC
  Administered 2016-12-27 – 2016-12-29 (×3): 2 via ORAL
  Filled 2016-12-26 (×3): qty 2

## 2016-12-26 MED ORDER — LACTATED RINGERS IV SOLN: INTRAVENOUS | Status: DC

## 2016-12-26 MED ORDER — OXYTOCIN 10 UNIT/ML IJ SOLN
INTRAMUSCULAR | Status: AC
  Filled 2016-12-26: qty 2

## 2016-12-26 MED ORDER — AMMONIA AROMATIC IN INHA
RESPIRATORY_TRACT | Status: AC
  Filled 2016-12-26: qty 10

## 2016-12-26 MED ORDER — IBUPROFEN 600 MG PO TABS: 600.0000 mg | ORAL_TABLET | Freq: Four times a day (QID) | ORAL | Status: DC | PRN

## 2016-12-26 MED ORDER — WITCH HAZEL-GLYCERIN EX PADS: 1.0000 "application " | MEDICATED_PAD | CUTANEOUS | Status: DC | PRN

## 2016-12-26 MED ORDER — BUPIVACAINE 0.25 % ON-Q PUMP DUAL CATH 400 ML
400.0000 mL | INJECTION | Status: DC
  Filled 2016-12-26: qty 400

## 2016-12-26 MED ORDER — NALOXONE HCL 2 MG/2ML IJ SOSY
1.0000 ug/kg/h | PREFILLED_SYRINGE | INTRAVENOUS | Status: DC | PRN
  Filled 2016-12-26: qty 2

## 2016-12-26 MED ORDER — PRENATAL MULTIVITAMIN CH
1.0000 | ORAL_TABLET | Freq: Every day | ORAL | Status: DC
  Administered 2016-12-27 – 2016-12-28 (×2): 1 via ORAL
  Filled 2016-12-26 (×2): qty 1

## 2016-12-26 MED ORDER — BUPIVACAINE HCL 0.5 % IJ SOLN
INTRAMUSCULAR | Status: DC | PRN
  Administered 2016-12-26: 30 mL

## 2016-12-26 MED ORDER — BUPIVACAINE-EPINEPHRINE 0.5% -1:200000 IJ SOLN: 20.0000 mL | Freq: Once | INTRAMUSCULAR | Status: DC

## 2016-12-26 MED ORDER — SOD CITRATE-CITRIC ACID 500-334 MG/5ML PO SOLN
ORAL | Status: AC
  Administered 2016-12-26: 30 mL
  Filled 2016-12-26: qty 15

## 2016-12-26 MED ORDER — SIMETHICONE 80 MG PO CHEW
80.0000 mg | CHEWABLE_TABLET | Freq: Three times a day (TID) | ORAL | Status: DC
Start: 2016-12-26 — End: 2016-12-29
  Administered 2016-12-27 – 2016-12-29 (×7): 80 mg via ORAL
  Filled 2016-12-26 (×8): qty 1

## 2016-12-26 MED ORDER — DEXTROSE 5 % IV SOLN
2.0000 g | Freq: Once | INTRAVENOUS | Status: DC
  Filled 2016-12-26: qty 2000

## 2016-12-26 MED ORDER — ONDANSETRON HCL 4 MG/2ML IJ SOLN
INTRAMUSCULAR | Status: DC | PRN
  Administered 2016-12-26: 4 mg via INTRAVENOUS

## 2016-12-26 MED ORDER — AZITHROMYCIN 500 MG IV SOLR
500.0000 mg | Freq: Once | INTRAVENOUS | Status: AC
  Administered 2016-12-26: 500 mg via INTRAVENOUS
  Filled 2016-12-26: qty 500

## 2016-12-26 MED ORDER — CEFAZOLIN SODIUM-DEXTROSE 2-3 GM-% IV SOLR
INTRAVENOUS | Status: DC | PRN
  Administered 2016-12-26: 2 g via INTRAVENOUS

## 2016-12-26 MED ORDER — ACETAMINOPHEN 325 MG PO TABS: 650.0000 mg | ORAL_TABLET | ORAL | Status: DC | PRN

## 2016-12-26 MED ORDER — LACTATED RINGERS IV SOLN
INTRAVENOUS | Status: DC
  Administered 2016-12-26 (×2): via INTRAVENOUS

## 2016-12-26 MED ORDER — OXYCODONE-ACETAMINOPHEN 5-325 MG PO TABS: 2.0000 | ORAL_TABLET | ORAL | Status: DC | PRN

## 2016-12-26 MED ORDER — MENTHOL 3 MG MT LOZG
1.0000 | LOZENGE | OROMUCOSAL | Status: DC | PRN
  Filled 2016-12-26: qty 9

## 2016-12-26 MED ORDER — BUPIVACAINE HCL (PF) 0.5 % IJ SOLN
INTRAMUSCULAR | Status: AC
  Filled 2016-12-26: qty 30

## 2016-12-26 MED ORDER — MORPHINE SULFATE (PF) 0.5 MG/ML IJ SOLN
INTRAMUSCULAR | Status: AC
  Filled 2016-12-26: qty 10

## 2016-12-26 MED ORDER — MORPHINE SULFATE (PF) 0.5 MG/ML IJ SOLN
INTRAMUSCULAR | Status: DC | PRN
  Administered 2016-12-26: .1 mg via INTRATHECAL

## 2016-12-26 MED ORDER — SCOPOLAMINE 1 MG/3DAYS TD PT72: 1.0000 | MEDICATED_PATCH | Freq: Once | TRANSDERMAL | Status: DC

## 2016-12-26 MED ORDER — CEFAZOLIN SODIUM-DEXTROSE 2-4 GM/100ML-% IV SOLN: 2.0000 g | INTRAVENOUS | Status: DC

## 2016-12-26 MED ORDER — OXYCODONE-ACETAMINOPHEN 5-325 MG PO TABS
1.0000 | ORAL_TABLET | ORAL | Status: DC | PRN
  Administered 2016-12-28 – 2016-12-29 (×5): 1 via ORAL
  Filled 2016-12-26 (×5): qty 1

## 2016-12-26 MED ORDER — ONDANSETRON HCL 4 MG/2ML IJ SOLN: 4.0000 mg | Freq: Three times a day (TID) | INTRAMUSCULAR | Status: DC | PRN

## 2016-12-26 MED ORDER — OXYTOCIN 10 UNIT/ML IJ SOLN
INTRAMUSCULAR | Status: AC
  Filled 2016-12-26: qty 1

## 2016-12-26 MED ORDER — FENTANYL CITRATE (PF) 100 MCG/2ML IJ SOLN
INTRAMUSCULAR | Status: DC | PRN
  Administered 2016-12-26: 20 ug via INTRATHECAL

## 2016-12-26 MED ORDER — PHENYLEPHRINE 40 MCG/ML (10ML) SYRINGE FOR IV PUSH (FOR BLOOD PRESSURE SUPPORT)
PREFILLED_SYRINGE | INTRAVENOUS | Status: DC | PRN
  Administered 2016-12-26 (×13): 100 ug via INTRAVENOUS

## 2016-12-26 MED ORDER — TERBUTALINE SULFATE 1 MG/ML IJ SOLN
INTRAMUSCULAR | Status: AC
  Administered 2016-12-26: 1 mg
  Filled 2016-12-26: qty 1

## 2016-12-26 MED ORDER — COCONUT OIL OIL
1.0000 "application " | TOPICAL_OIL | Status: DC | PRN
  Administered 2016-12-28: 1 via TOPICAL
  Filled 2016-12-26: qty 120

## 2016-12-26 MED ORDER — OXYTOCIN 40 UNITS IN LACTATED RINGERS INFUSION - SIMPLE MED
2.5000 [IU]/h | INTRAVENOUS | Status: DC
  Administered 2016-12-26: 600 mL via INTRAVENOUS
  Filled 2016-12-26 (×2): qty 1000

## 2016-12-26 MED ORDER — BUTORPHANOL TARTRATE 1 MG/ML IJ SOLN
1.0000 mg | INTRAMUSCULAR | Status: DC | PRN
  Administered 2016-12-26 (×2): 1 mg via INTRAVENOUS
  Filled 2016-12-26 (×2): qty 1

## 2016-12-26 MED ORDER — FENTANYL CITRATE (PF) 100 MCG/2ML IJ SOLN
INTRAMUSCULAR | Status: AC
  Filled 2016-12-26: qty 2

## 2016-12-26 MED ORDER — ONDANSETRON HCL 4 MG/2ML IJ SOLN
INTRAMUSCULAR | Status: AC
Start: 2016-12-26 — End: 2016-12-26
  Filled 2016-12-26: qty 2

## 2016-12-26 MED ORDER — PROPOFOL 10 MG/ML IV BOLUS
INTRAVENOUS | Status: AC
  Filled 2016-12-26: qty 20

## 2016-12-26 MED ORDER — SIMETHICONE 80 MG PO CHEW: 80.0000 mg | CHEWABLE_TABLET | ORAL | Status: DC | PRN

## 2016-12-26 MED ORDER — DIPHENHYDRAMINE HCL 25 MG PO CAPS
25.0000 mg | ORAL_CAPSULE | Freq: Four times a day (QID) | ORAL | Status: DC | PRN
Start: 2016-12-26 — End: 2016-12-29

## 2016-12-26 MED ORDER — DIPHENHYDRAMINE HCL 50 MG/ML IJ SOLN: 12.5000 mg | INTRAMUSCULAR | Status: DC | PRN

## 2016-12-26 MED ORDER — SODIUM CHLORIDE 0.9% FLUSH: 3.0000 mL | INTRAVENOUS | Status: DC | PRN

## 2016-12-26 MED ORDER — MISOPROSTOL 200 MCG PO TABS
ORAL_TABLET | ORAL | Status: AC
  Filled 2016-12-26: qty 4

## 2016-12-26 SURGICAL SUPPLY — 28 items
BAG COUNTER SPONGE EZ (MISCELLANEOUS) ×2 IMPLANT
CANISTER SUCT 3000ML (MISCELLANEOUS) ×2 IMPLANT
CATH KIT ON-Q SILVERSOAK 5IN (CATHETERS) ×4 IMPLANT
CHLORAPREP W/TINT 26ML (MISCELLANEOUS) ×4 IMPLANT
DERMABOND ADVANCED (GAUZE/BANDAGES/DRESSINGS) ×1
DERMABOND ADVANCED .7 DNX12 (GAUZE/BANDAGES/DRESSINGS) ×1 IMPLANT
DRSG OPSITE POSTOP 4X10 (GAUZE/BANDAGES/DRESSINGS) ×2 IMPLANT
DRSG TELFA 3X8 NADH (GAUZE/BANDAGES/DRESSINGS) ×2 IMPLANT
ELECT CAUTERY BLADE 6.4 (BLADE) ×2 IMPLANT
ELECT REM PT RETURN 9FT ADLT (ELECTROSURGICAL) ×2
ELECTRODE REM PT RTRN 9FT ADLT (ELECTROSURGICAL) ×1 IMPLANT
GAUZE SPONGE 4X4 12PLY STRL (GAUZE/BANDAGES/DRESSINGS) ×2 IMPLANT
GLOVE BIO SURGEON STRL SZ7 (GLOVE) ×2 IMPLANT
GLOVE INDICATOR 7.5 STRL GRN (GLOVE) ×2 IMPLANT
GOWN STRL REUS W/ TWL LRG LVL3 (GOWN DISPOSABLE) ×3 IMPLANT
GOWN STRL REUS W/TWL LRG LVL3 (GOWN DISPOSABLE) ×3
HANDLE YANKAUER SUCT BULB TIP (MISCELLANEOUS) ×2 IMPLANT
NS IRRIG 1000ML POUR BTL (IV SOLUTION) ×2 IMPLANT
PACK C SECTION AR (MISCELLANEOUS) ×2 IMPLANT
PAD OB MATERNITY 4.3X12.25 (PERSONAL CARE ITEMS) ×2 IMPLANT
PAD PREP 24X41 OB/GYN DISP (PERSONAL CARE ITEMS) ×2 IMPLANT
STAPLER INSORB 30 2030 C-SECTI (MISCELLANEOUS) ×2 IMPLANT
STRIP CLOSURE SKIN 1/2X4 (GAUZE/BANDAGES/DRESSINGS) ×2 IMPLANT
SUT MNCRL AB 4-0 PS2 18 (SUTURE) ×2 IMPLANT
SUT PDS AB 1 TP1 96 (SUTURE) ×4 IMPLANT
SUT VIC AB 0 CTX 36 (SUTURE) ×2
SUT VIC AB 0 CTX36XBRD ANBCTRL (SUTURE) ×2 IMPLANT
SUT VIC AB 2-0 CT1 36 (SUTURE) ×2 IMPLANT

## 2016-12-26 NOTE — Anesthesia Post-op Follow-up Note (Cosign Needed)
Anesthesia QCDR form completed.        

## 2016-12-26 NOTE — Progress Notes (Signed)
Subjective:  Called to evaluate patient for prolonged deceleration following amniotomy.  Fetus had recovered patient administered terbutaline and supplemental O2.  Category I tracing following recovery, proceeded to make change to complete but failed to show any descent in station with pushing.    Objective:   Vitals: Blood pressure 105/65, pulse 99, temperature 98.4 F (36.9 C), temperature source Oral, resp. rate 18, height  (1.6 m), weight 206 lb (93.4 kg), last menstrual period 03/17/2016. General: Painfully contracting Abdomen: gravid, non-tender Cervical Exam:  Dilation: 10 Dilation Complete Date: 12/26/16 Dilation Complete Time: 1309 Effacement (%): 90 Station: +1 Presentation: Vertex Exam by:: AMS, MD  FHT: 140, moderate, +accels, occasional early vs variable deceleration Toco: q71min  Results for orders placed or performed during the hospital encounter of 12/26/16 (from the past 24 hour(s))  Type and screen [facility]     Status: None   Collection Time: 12/26/16  4:39 AM  Result Value Ref Range   ABO/RH(D) A POS    Antibody Screen NEG    Sample Expiration 12/29/2016   CBC     Status: Abnormal   Collection Time: 12/26/16  4:42 AM  Result Value Ref Range   WBC 9.8 3.6 - 11.0 K/uL   RBC 4.20 3.80 - 5.20 MIL/uL   Hemoglobin 10.2 (L) 12.0 - 16.0 g/dL   HCT 40.9 (L) 81.1 - 91.4 %   MCV 73.5 (L) 80.0 - 100.0 fL   MCH 24.4 (L) 26.0 - 34.0 pg   MCHC 33.2 32.0 - 36.0 g/dL   RDW 78.2 (H) 95.6 - 21.3 %   Platelets 131 (L) 150 - 440 K/uL    Assessment:   26 y.o. [id number] [redacted]w[redacted]d term labor with second stage arrest secondary to presumed CP  Plan:   1) Labor - has pushed for about 40 minutes no descent in station without epidural.  Baby ROA but significantly off midline almost OT.  Unable to manually rotate the fetus.  The patient was counseled regarding risk and benefits to proceeding with Cesarean section to expedite delivery.  Risk of cesarean  section were discussed including risk of bleeding and need for potential intraoperative or postoperative blood transfusion with a rate of approximately 5% quoted for all Cesarean sections, risk of injury to adjacent organs including but not limited to bowl and bladder, the need for additional surgical procedures to address such injuries, and the risk of infection.  The risk of continued attempts at vaginal delivery include but are note limited to worsening fetal or maternal status.  After consideration of options the patient is amenable to proceed with primary cesarean section for delivery.   2) Fetus - Cat I tracing

## 2016-12-26 NOTE — Progress Notes (Signed)
Per AMS, MD, infant still high even with good pushing effort, will allow pt to labor down before resuming pushing Peri care performed and pt positioned with pillows, support persons at bedside

## 2016-12-26 NOTE — Anesthesia Preprocedure Evaluation (Signed)
Anesthesia Evaluation  Patient identified by MRN, date of birth, ID band Patient awake    Reviewed: Allergy & Precautions, H&P , NPO status , Patient's Chart, lab work & pertinent test results, reviewed documented beta blocker date and time   Airway Mallampati: II  TM Distance: >3 FB Neck ROM: full    Dental no notable dental hx. (+) Teeth Intact   Pulmonary neg pulmonary ROS, Current Smoker,    Pulmonary exam normal breath sounds clear to auscultation       Cardiovascular Exercise Tolerance: Good negative cardio ROS   Rhythm:regular Rate:Normal     Neuro/Psych  Headaches, negative neurological ROS  negative psych ROS   GI/Hepatic negative GI ROS, Neg liver ROS,   Endo/Other  negative endocrine ROSdiabetes  Renal/GU      Musculoskeletal   Abdominal   Peds  Hematology negative hematology ROS (+)   Anesthesia Other Findings   Reproductive/Obstetrics (+) Pregnancy                             Anesthesia Physical Anesthesia Plan  ASA: II and emergent  Anesthesia Plan: Epidural   Post-op Pain Management:    Induction:   Airway Management Planned:   Additional Equipment:   Intra-op Plan:   Post-operative Plan:   Informed Consent: I have reviewed the patients History and Physical, chart, labs and discussed the procedure including the risks, benefits and alternatives for the proposed anesthesia with the patient or authorized representative who has indicated his/her understanding and acceptance.     Plan Discussed with:   Anesthesia Plan Comments:         Anesthesia Quick Evaluation

## 2016-12-26 NOTE — Op Note (Signed)
Preoperative Diagnosis: 1) 26 y.o. [id number] at [redacted]w[redacted]d 2) Second stage arrest  Postoperative Diagnosis: 1) 26 y.o. [id number] at [redacted]w[redacted]d 2) Second stage arrest  Operation Performed: Primary low transverse C-section via pfannenstiel skin incision  Indication: Second stage arrest  Anesthesia: Spinal  Primary Surgeon: Vena Austria, MD  Assistant:  Preoperative Antibiotics: 2g ancef and  of azithromycin  Estimated Blood Loss:89mL  IV Fluids:  Urine Output:: pending  Drains or Tubes: Foley to gravity drainage, ON-Q catheter system  Implants: none  Specimens Removed: none  Complications: none  Intraoperative Findings:  Normal tubes ovaries and uterus.  Delivery resulted in the birth of a liveborn female, APGAR (1 MIN): 9   APGAR (5 MINS): 9   APGAR (10 MINS): N/A  Weight  9lbs 4oz  Patient Condition: stable  Procedure in Detail:  Patient was taken to the operating room were she was administered regional anesthesia.  She was positioned in the supine position, prepped and draped in the  Usual sterile fashion.  Prior to proceeding with the case a time out was performed and the level of anesthetic was checked and noted to be adequate.  Utilizing the scalpel a pfannenstiel skin incision was made 2cm above the pubic symphysis and carried down sharply to the the level of the rectus fascia.  The fascia was incised in the midline using the scalpel and then extended using mayo scissors.  The superior border of the rectus fascia was grasped with two Kocher clamps and the underlying rectus muscles were dissected of the fascia using blunt dissection.  The median raphae was incised using Mayo scissors.   The inferior border of the rectus fascia was dissected of the rectus muscles in a similar fashion.  The midline was identified, the peritoneum was entered bluntly and expanded using manual tractions.  The uterus was noted to be in a none rotated position.  Next the bladder blade was  placed retracting the bladder caudally.  A bladder flap was created. The bladder reflection was grasped with a pickup, and Metzenbaum scissors were then used the undermine the bladder reflection.  The bladder flap was developed using digital dissection.  The bladder blade was replaced retracting the bladder caudally out of the operative field.   A low transverse incision was scored on the lower uterine segment.  The hysterotomy was entered bluntly using the operators finger.  The hysterotomy incision was extended using manual traction.  The operators hand was placed within the hysterotomy position noting the fetus to be within the OA position.  The vertex was grasped, flexed, brought to the incision, and delivered a traumatically using fundal pressure.  The remainder of the body delivered with ease.  The infant was suctioned, cord was clamped and cut before handing off to the awaiting neonatologist.  The placenta was delivered using manual extraction.  The uterus was exteriorized, wiped clean of clots and debris using two moist laps.  The hysterotomy was closed using a two layer closure of 0 Vicryl, with the first being a running locked, the second a vertical imbricating.  The uterus was returned to the abdomen.  The peritoneal gutters were wiped clean of clots and debris using two moist laps.  The hysterotomy incision was re-inspected noted to be hemostatic.  The rectus muscles were re-approximated in the midline using a single 2-0 Vicryl mattress stitch.  The rectus muscles were inspected noted to be hemostatic.  The superior border of the rectus fascia was grasped with a Kocher clamp.  The ON-Q trocars were then placed 4cm above the superior border of the incision and tunneled subfascially.  The introducers were removed and the catheters were threaded through the sleeves after which the sleeves were removed.  The fascia was closed using a looped #1 PDS in a running fashion taking 1cm by 1cm bites.  The  subcutaneous tissue was irrigated using warm saline, hemostasis achieved using the bovie.  The subcutaneous dead space was less than 3cm and was not closed.  The skin was closed using insorb staples.  Sponge needle and instrument counts were corrects times two.  The patient tolerated the procedure well and was taken to the recovery room in stable condition.

## 2016-12-26 NOTE — Transfer of Care (Signed)
Immediate Anesthesia Transfer of Care Note  Patient: Amber Duffy  Procedure(s) Performed: Procedure(s): CESAREAN SECTION (N/A)  Patient Location: PACU  Anesthesia Type:Spinal  Level of Consciousness: awake, alert  and oriented  Airway & Oxygen Therapy: Patient Spontanous Breathing  Post-op Assessment: Report given to RN and Post -op Vital signs reviewed and stable  Post vital signs: Reviewed and stable  Last Vitals:  Vitals:   12/26/16 1048 12/26/16 1300  BP: 105/65   Pulse: 99   Resp:    Temp: 36.6 C 36.9 C    Last Pain:  Vitals:   12/26/16 1300  TempSrc: Oral  PainSc:          Complications: No apparent anesthesia complications

## 2016-12-26 NOTE — Discharge Summary (Signed)
OB Discharge Summary     Patient Name: Amber Duffy DOB: [birth year] MRN: [id number]  Date of admission: 12/26/2016 Delivering MD: Vena Austria   Date of discharge: 12/29/2016 Admitting diagnosis: IUP at 39 weeks in labor Intrauterine pregnancy: [redacted]w[redacted]d     Secondary diagnosis:   Additional problems: Second stage arrest     Discharge diagnosis: Term Pregnancy Delivered                                                                                                Post partum procedures:Varivax  Augmentation: AROM  Complications: None  Hospital course:  Onset of Labor With Unplanned C/S  26 y.o. yo [id number] at [redacted]w[redacted]d was admitted in Active Labor on 12/26/2016. Patient had a labor course significant for second stage arrest, one prolonged deceleration. Membrane Rupture Time/Date: 10:41 AM ,12/26/2016   The patient went for cesarean section due to Arrest of Descent, and delivered a Viable infant,12/26/2016  Details of operation can be found in separate operative note. Patient had an uncomplicated postpartum course.  She is ambulating,tolerating a regular diet, passing flatus, and urinating well.  Patient is discharged home in stable condition 12/29/16.  Physical exam  Vitals:   12/28/16 1855 12/29/16 0047 12/29/16 0329 12/29/16 0804  BP: 101/62   111/70  Pulse: 77   81  Resp: 18   20  Temp: 98.1 F (36.7 C) 97.5 F (36.4 C) 97.7 F (36.5 C) 98.3 F (36.8 C)  TempSrc: Oral Oral Oral Oral  SpO2:    100%  Weight:      Height:       General: alert, cooperative and no distress, denies lightheadedness Lochia: appropriate Heart: RRR without murmur Lungs: no difficulty with respirations, CTAB Abdomen: soft, bowel sounds active x 4 Incision: Dressing is clean, dry, and intact, ON Q intact DVT Evaluation: No evidence of DVT seen on physical exam. Labs: Lab Results  Component Value Date   WBC 10.2 12/28/2016   HGB 7.7 (L) 12/28/2016   HCT 23.6 (L) 12/28/2016   MCV 74.2 (L)  12/28/2016   PLT 139 (L) 12/28/2016   CMP Latest Ref Rng & Units 05/15/2015  Glucose 65 - 99 mg/dL 562(Z)  BUN 6 - 20 mg/dL 12  Creatinine 3.08 - 6.57 mg/dL 8.46  Sodium 962 - 952 mmol/L 135  Potassium 3.5 - 5.1 mmol/L 3.3(L)  Chloride 101 - 111 mmol/L 106  CO2 22 - 32 mmol/L 22  Calcium 8.9 - 10.3 mg/dL 9.0    Discharge instruction: per After Visit Summary and "Baby and Me Booklet". Safety issues regarding anemia discussed. No driving for at least 2 weeks  After visit meds:  Allergies as of 12/29/2016   No Known Allergies     Medication List    STOP taking these medications   ferrous sulfate 325 (65 FE) MG EC tablet   multivitamin-prenatal 27-0.8 MG Tabs tablet     TAKE these medications   CITRANATAL BLOOM 90-1 MG Tabs Take 1 tablet by mouth daily at 2 PM.   ibuprofen 600 MG tablet Commonly known as:  ADVIL,MOTRIN Take 1  tablet (600 mg total) by mouth every 6 (six) hours as needed for mild pain.   oxyCODONE-acetaminophen 5-325 MG tablet Commonly known as:  PERCOCET/ROXICET Take 1-2 tablets by mouth every 6 (six) hours as needed for moderate pain or severe pain.   senna-docusate 8.6-50 MG tablet Commonly known as:  Senokot-S Take 2 tablets by mouth at bedtime.       Diet: routine diet  Activity: Advance as tolerated. Pelvic rest for 6 weeks.   Outpatient follow up:1 week for wound check   Follow up Visit:1 week for wound check Postpartum contraception: IUD Mirena  Newborn Data: Live born female / Simonne Come Birth Weight: 9 lb 4.2 oz (4200 g) APGAR: 9, 9  Baby Feeding: Bottle and Breast Disposition:home with mother   12/29/2016 Farrel Conners, CNM

## 2016-12-26 NOTE — Anesthesia Procedure Notes (Signed)
Spinal  Patient location during procedure: OR Staffing Performed: anesthesiologist  Preanesthetic Checklist Completed: patient identified, site marked, surgical consent, pre-op evaluation, timeout performed, IV checked, risks and benefits discussed and monitors and equipment checked Spinal Block Patient position: sitting Prep: Betadine Patient monitoring: heart rate, continuous pulse ox, blood pressure and cardiac monitor Approach: midline Location: L4-5 Injection technique: single-shot Needle Needle type: Whitacre and Introducer  Needle gauge: 24 G Needle length: 9 cm Assessment Sensory level: T4 Additional Notes Negative paresthesia. Negative blood return. Positive free-flowing CSF. Expiration date of kit checked and confirmed. Patient tolerated procedure well, without complications.       

## 2016-12-26 NOTE — H&P (Signed)
OB History & Physical   History of Present Illness:  Chief Complaint:   HPI:  Amber Duffy is a 26 y.o. G9P1031 female with EDC=01/01/2017 at [redacted]w[redacted]d dated by a 6wk5d ultrasound.  Her pregnancy has been complicated by a history of recurrent miscarriages and obesity with BMI>30. Current BMI=37.2. Total weight gain 20#. She presented to L&D for evaluation of labor this AM at 0300. Complained of strong regular contractions. Denied bleeding and leakage of fluid.   Prenatal care site: Prenatal care at [facility]. She desires to breast feed. Contraception: IUD or Nexplanon. Last TDAP was in 2016       Maternal Medical History:   Past Medical History:  Diagnosis Date  . Migraine   . Missed abortions   . Obesity (BMI 35.0-39.9 without comorbidity)     Past Surgical History:  Procedure Laterality Date  . DILATION AND CURETTAGE OF UTERUS    . DILATION AND EVACUATION N/A 02/26/2015   Procedure: DILATATION AND EVACUATION;  Surgeon: Nadara Mustard, MD;  Location: [facility] ORS;  Service: Gynecology;  Laterality: N/A;  . DILATION AND EVACUATION N/A 05/26/2015   Procedure: DILATATION AND EVACUATION;  Surgeon: [place] Bing, MD;  Location: [facility] ORS;  Service: Gynecology;  Laterality: N/A;    No Known Allergies  Prior to Admission medications   Medication Sig Start Date End Date Taking? Authorizing Provider  ferrous sulfate 325 (65 FE) MG EC tablet Take 325 mg by mouth 3 (three) times daily with meals.   Yes Historical Provider, MD  Prenatal Vit-Fe Fumarate-FA (MULTIVITAMIN-PRENATAL) 27-0.8 MG TABS tablet Take 1 tablet by mouth daily at 12 noon.   Yes Historical Provider, MD          Social History: She  reports that she has never smoked. She has never used smokeless tobacco. She reports that she does not drink alcohol or use drugs.  Family History: family history includes Cancer in her maternal aunt.   Review of Systems: Negative x 10 systems reviewed except as noted in the HPI.       Physical Exam:  Vital Signs: BP 102/75 (BP Location: Left Arm)   Pulse 88   Temp 97.9 F (36.6 C) (Oral)   Resp 18   Ht  (1.6 m)   Wt 93.4 kg (206 lb)   LMP 03/17/2016   BMI 36.49 kg/m  General:moaning with contractions HEENT: normocephalic, atraumatic Heart: regular rate & rhythm.  No murmurs Lungs: clear to auscultation bilaterally Abdomen: soft, gravid, non-tender;  EFW: 8# Pelvic:   External: Normal external female genitalia  Cervix: Dilation: 5 / Effacement (%): 80 / Station: -1   Extremities: non-tender, symmetric, traceedema bilaterally.  DTRs: +1  Neurologic: Alert & oriented x 3.    Pertinent Results:  Prenatal Labs: Blood type/Rh A positive  Antibody screen Negative  Rubella Varicella Immune Non immune  RPR Non reactive  HBsAg negative  HIV negative  GC negative  Chlamydia negative  Genetic screening negative First trimester test  1 hour GTT 94  3 hour GTT N/A  GBS negative on 12/08/2016   Baseline FHR: 135 with accelerations to 150s, moderate variability Toco: contractions every 1-4 min apart   Assessment:  Amber Duffy is a 26 y.o. G64P1031 female at [redacted]w[redacted]d in labor FWB-Cat 1   Plan:  1. Has already been admitted to Labor & Delivery   2. CBC, T&S, Clrs, IVF 3. GBS negative.   4. Consents obtained. 5. Discussed pain relief measures-wanting stadol at this time. Epidural if  desires (although has not planned for an epidural)  Farrel Conners  12/26/2016 8:54 AM

## 2016-12-27 ENCOUNTER — Encounter: Payer: Self-pay | Admitting: Obstetrics and Gynecology

## 2016-12-27 LAB — CBC
HEMATOCRIT: 24.5 % — AB (ref 35.0–47.0)
Hemoglobin: 7.8 g/dL — ABNORMAL LOW (ref 12.0–16.0)
MCH: 23.9 pg — ABNORMAL LOW (ref 26.0–34.0)
MCHC: 31.9 g/dL — AB (ref 32.0–36.0)
MCV: 74.9 fL — ABNORMAL LOW (ref 80.0–100.0)
PLATELETS: 122 10*3/uL — AB (ref 150–440)
RBC: 3.27 MIL/uL — ABNORMAL LOW (ref 3.80–5.20)
RDW: 18.8 % — AB (ref 11.5–14.5)
WBC: 13.3 10*3/uL — AB (ref 3.6–11.0)

## 2016-12-27 LAB — RPR: RPR Ser Ql: NONREACTIVE

## 2016-12-27 NOTE — Anesthesia Post-op Follow-up Note (Cosign Needed)
  Anesthesia Pain Follow-up Note  Patient: Amber Duffy  Day #: 1  Date of Follow-up: 12/27/2016 Time: 7:19 AM  Last Vitals:  Vitals:   12/26/16 2320 12/27/16 0332  BP: (!) 90/55 (!) 96/54  Pulse: 82 84  Resp: 18 18  Temp: 37.1 C 37 C    Level of Consciousness: alert  Pain: none   Side Effects:None  Catheter Site Exam:clean, dry     Plan: D/C from anesthesia care at surgeon's request  Adis Sturgill    

## 2016-12-27 NOTE — Anesthesia Postprocedure Evaluation (Signed)
Anesthesia Post Note  Patient: Amber Duffy  Procedure(s) Performed: Procedure(s) (LRB): CESAREAN SECTION (N/A)  Patient location during evaluation: Mother Baby Anesthesia Type: Spinal Level of consciousness: awake, awake and alert and oriented Pain management: pain level controlled Vital Signs Assessment: post-procedure vital signs reviewed and stable Respiratory status: spontaneous breathing, nonlabored ventilation and respiratory function stable Cardiovascular status: blood pressure returned to baseline and stable Postop Assessment: no headache and no backache Anesthetic complications: no     Last Vitals:  Vitals:   12/26/16 2320 12/27/16 0332  BP: (!) 90/55 (!) 96/54  Pulse: 82 84  Resp: 18 18  Temp: 37.1 C 37 C    Last Pain:  Vitals:   12/27/16 0332  TempSrc: Oral  PainSc:                  Ginger Carne

## 2016-12-27 NOTE — Anesthesia Post-op Follow-up Note (Signed)
  Anesthesia Pain Follow-up Note  Patient: Amber Duffy  Day #: 1  Date of Follow-up: 12/27/2016 Time: 7:22 AM  Last Vitals:  Vitals:   12/26/16 2320 12/27/16 0332  BP: (!) 90/55 (!) 96/54  Pulse: 82 84  Resp: 18 18  Temp: 37.1 C 37 C    Level of Consciousness: alert  Pain: none   Side Effects:None  Catheter Site Exam:clean, dry     Plan: D/C from anesthesia care at surgeon's request  [facility]

## 2016-12-27 NOTE — Anesthesia Post-op Follow-up Note (Signed)
  Anesthesia Pain Follow-up Note  Patient: Amber Duffy  Day #: 1  Date of Follow-up: 12/27/2016 Time: 7:19 AM  Last Vitals:  Vitals:   12/26/16 2320 12/27/16 0332  BP: (!) 90/55 (!) 96/54  Pulse: 82 84  Resp: 18 18  Temp: 37.1 C 37 C    Level of Consciousness: alert  Pain: none   Side Effects:None  Catheter Site Exam:clean, dry     Plan: D/C from anesthesia care at surgeon's request  [facility]

## 2016-12-27 NOTE — Progress Notes (Signed)
  Subjective:   Doing well Post Op Day 1. Ambulating the first time OOB with assistance and voiding without difficulty. Tolerating PO intake and pain is tolerated with PO meds and On Q Pump. Breastfeeding- pt states baby is not latching well and is requesting assistance with feeding. Pt denies feeling lightheaded, dizzy, SOB.   Objective:  Blood pressure (!) 90/44, pulse 80, temperature 98.3 F (36.8 C), temperature source Oral, resp. rate 17, height  (1.6 m), weight 206 lb (93.4 kg), last menstrual period 03/17/2016, SpO2 98 %, currently breastfeeding.  General: NAD Pulmonary: no increased work of breathing Abdomen: non-distended, non-tender, fundus firm at level of umbilicus Incision: Waffle dressing is C/D/I, On Q pump intact, no s/s infection Extremities: no edema, no erythema, no tenderness  Results for orders placed or performed during the hospital encounter of 12/26/16 (from the past 24 hour(s))  CBC     Status: Abnormal   Collection Time: 12/27/16  5:58 AM  Result Value Ref Range   WBC 13.3 (H) 3.6 - 11.0 K/uL   RBC 3.27 (L) 3.80 - 5.20 MIL/uL   Hemoglobin 7.8 (L) 12.0 - 16.0 g/dL   HCT 69.6 (L) 29.5 - 28.4 %   MCV 74.9 (L) 80.0 - 100.0 fL   MCH 23.9 (L) 26.0 - 34.0 pg   MCHC 31.9 (L) 32.0 - 36.0 g/dL   RDW 13.2 (H) 44.0 - 10.2 %   Platelets 122 (L) 150 - 440 K/uL    Intake/Output Summary (Last 24 hours) at 12/27/16 0951 Last data filed at 12/27/16 0930  Gross per 24 hour  Intake             2737 ml  Output             2525 ml  Net              212 ml     Assessment:   26 y.o. [id number] postoperativeday # 1   Plan:  1) Acute blood loss anemia - hemodynamically stable and asymptomatic - po ferrous sulfate  2) A positive, Rubella Immune, Varicella non-immune  3) TDAP status: needs prior to discharge  4) Breast/Contraception: IUD or Nexplanon  5) Disposition: discharge to home day 2 or 3 depending on progress   Tresea Mall, CNM

## 2016-12-28 DIAGNOSIS — Z98891 History of uterine scar from previous surgery: Secondary | ICD-10-CM

## 2016-12-28 DIAGNOSIS — O09899 Supervision of other high risk pregnancies, unspecified trimester: Secondary | ICD-10-CM

## 2016-12-28 DIAGNOSIS — D62 Acute posthemorrhagic anemia: Secondary | ICD-10-CM | POA: Diagnosis not present

## 2016-12-28 DIAGNOSIS — Z283 Underimmunization status: Secondary | ICD-10-CM

## 2016-12-28 HISTORY — DX: History of uterine scar from previous surgery: Z98.891

## 2016-12-28 LAB — CBC
HCT: 23.6 % — ABNORMAL LOW (ref 35.0–47.0)
Hemoglobin: 7.7 g/dL — ABNORMAL LOW (ref 12.0–16.0)
MCH: 24.1 pg — ABNORMAL LOW (ref 26.0–34.0)
MCHC: 32.5 g/dL (ref 32.0–36.0)
MCV: 74.2 fL — ABNORMAL LOW (ref 80.0–100.0)
PLATELETS: 139 10*3/uL — AB (ref 150–440)
RBC: 3.18 MIL/uL — ABNORMAL LOW (ref 3.80–5.20)
RDW: 18.9 % — AB (ref 11.5–14.5)
WBC: 10.2 10*3/uL (ref 3.6–11.0)

## 2016-12-28 MED ORDER — TETANUS-DIPHTH-ACELL PERTUSSIS 5-2.5-18.5 LF-MCG/0.5 IM SUSP: 0.5000 mL | INTRAMUSCULAR | Status: DC | PRN

## 2016-12-28 MED ORDER — VARICELLA VIRUS VACCINE LIVE 1350 PFU/0.5ML IJ SUSR
0.5000 mL | INTRAMUSCULAR | Status: AC | PRN
  Administered 2016-12-29: 0.5 mL via SUBCUTANEOUS
  Filled 2016-12-28: qty 0.5

## 2016-12-28 NOTE — Progress Notes (Signed)
Patient ID: Amber Duffy, female   DOB: 1991-08-22, 26 y.o.   MRN: [id number] Obstetric Postpartum/PostOperative Daily Progress Note Subjective:  26 y.o. [id number] POD#2 status post primary cesarean delivery.  She is ambulating, is tolerating po, is voiding spontaneously.  Her pain is well controlled on PO pain medications. Her lochia is less than menses.  Denies symptoms of feeling lightheaded, dizzy, tired, SOB, and chest pain.    Medications SCHEDULED MEDICATIONS  . ibuprofen  600 mg Oral Q6H  . prenatal multivitamin  1 tablet Oral Q1200  . scopolamine  1 patch Transdermal Once  . senna-docusate  2 tablet Oral Q24H  . simethicone  80 mg Oral TID PC  . simethicone  80 mg Oral Q24H    MEDICATION INFUSIONS  . bupivacaine 0.25 % ON-Q pump DUAL CATH 400 mL    . lactated ringers Stopped (12/27/16 1208)  . naLOXone [facility]) adult infusion for PRURITIS      PRN MEDICATIONS  acetaminophen, coconut oil, witch hazel-glycerin **AND** dibucaine, diphenhydrAMINE, diphenhydrAMINE **OR** diphenhydrAMINE, ibuprofen, menthol-cetylpyridinium, meperidine (DEMEROL) injection, nalbuphine **OR** nalbuphine, nalbuphine **OR** nalbuphine, naLOXone (NARCAN) adult infusion for PRURITIS, naloxone **AND** sodium chloride flush, ondansetron (ZOFRAN) IV, oxyCODONE-acetaminophen, oxyCODONE-acetaminophen, simethicone    Objective:   Vitals:   12/28/16 0015 12/28/16 0321 12/28/16 0742 12/28/16 0745  BP: (!) 94/59 (!) 90/50 (!) 86/51 98/62  Pulse: 83 83 75   Resp: Temp: 98.3 F (36.8 C) 98.2 F (36.8 C) 97.7 F (36.5 C)   TempSrc: Oral Oral Oral   SpO2:  98% 97%   Weight:      Height:        Current Vital Signs 24h Vital Sign Ranges  T 97.7 F (36.5 C) Temp  Avg: 98.1 F (36.7 C)  Min: 97.7 F (36.5 C)  Max: 98.5 F (36.9 C)  BP 98/62 BP  Min: 86/51  Max: 105/56  HR 75 Pulse  Avg: 86  Min: 75  Max: 98  RR 16 Resp  Avg: 18.5  Min: 16  Max: 20  SaO2 97 % Not Delivered SpO2  Avg: 98 %  Min: 97 %   Max: 99 %       24 Hour I/O Current Shift I/O  Time Ins Outs 04/17 0701 - 04/18 0700 In: 743 [P.O.:360; I.V.:383] Out: 350 [Urine:350] No intake/output data recorded.  General: NAD Pulmonary: no increased work of breathing Abdomen: non-distended, non-tender, fundus firm at level of umbilicus Inc: Clean/dry/intact Extremities: no edema, no erythema, no tenderness  Labs:   Recent Labs Lab 12/26/16 0442 12/27/16 0558 12/28/16 0638  WBC 9.8 13.3* 10.2  HGB 10.2* 7.8* 7.7*  HCT 30.9* 24.5* 23.6*  PLT 131* 122* 139*     Assessment:   26 y.o. [id number] postoperative day # 2, s/p primary cesarean section, acute blood loss anemia, doing well.   Plan:  1) Acute blood loss anemia - hemodynamically stable and asymptomatic - po ferrous sulfate  2) A POS / Rubella Immune (09/19 0000)/ Varicella Not immune - varivax prior to discharge and at 6 wk appt.  3) TDAP status ordered for prior to discharge.   4) breast and bottle /Contraception = IUD  5) Disposition: home tomorrow if continues to be stable and progressing normally post-op.   Conard Novak, MD 12/28/2016 9:17 AM

## 2016-12-28 NOTE — Lactation Note (Signed)
This note was copied from a baby's chart. Lactation Consultation Note  Patient Name: Amber Duffy DIYME'B Date: 12/28/2016 Reason for consult: Follow-up assessment   Maternal Data    Feeding Feeding Type: Bottle Fed - Formula Nipple Type: Slow - flow Length of feed: 30 min  LATCH Score/Interventions                      Lactation Tools Discussed/Used     Consult Status Consult Status: Follow-up Mom isn't having adequate production. No sign of colostrum yesterday or today when trying hand expression. Mom has a SNS and has been encouraged to use. Due to wt loss of 7%, LC suggested: breast/bottle alternate feedings of no more than 1m per feeding. Mom also has pump kit to use inbetween nursings.   JMarnee Spring4/18/2018, 11:37 AM

## 2016-12-29 ENCOUNTER — Encounter: Payer: Medicaid Other | Admitting: Obstetrics & Gynecology

## 2016-12-29 MED ORDER — OXYCODONE-ACETAMINOPHEN 5-325 MG PO TABS: 1.0000 | ORAL_TABLET | Freq: Four times a day (QID) | ORAL | 0 refills | Status: DC | PRN

## 2016-12-29 MED ORDER — CITRANATAL BLOOM 90-1 MG PO TABS: 1.0000 | ORAL_TABLET | Freq: Every day | ORAL | 3 refills | Status: DC

## 2016-12-29 MED ORDER — IBUPROFEN 600 MG PO TABS: 600.0000 mg | ORAL_TABLET | Freq: Four times a day (QID) | ORAL | 1 refills | Status: DC | PRN

## 2016-12-29 MED ORDER — SENNOSIDES-DOCUSATE SODIUM 8.6-50 MG PO TABS: 2.0000 | ORAL_TABLET | Freq: Every day | ORAL | 0 refills | Status: DC

## 2016-12-29 NOTE — Progress Notes (Signed)
Pt gave permission to write a work note for [employer].

## 2016-12-29 NOTE — Progress Notes (Signed)
Reviewed all patients discharge instructions and handouts regarding postpartum bleeding, incision care, proper removal of on-q pump, no intercourse for 6 weeks, signs and symptoms of mastitis and postpartum bleu's. Reviewed discharge instructions for newborn regarding proper cord care, how and when to bathe the newborn, nail care, proper way to take the baby's temperature, along with safe sleep. All questions have been answered at this time. Patient discharged via wheelchair with axillary.

## 2016-12-30 ENCOUNTER — Emergency Department
Admission: EM | Admit: 2016-12-30 | Discharge: 2016-12-31 | Disposition: A | Discharge status: Home / Self Care | Payer: Medicaid Other | Attending: Emergency Medicine | Admitting: Emergency Medicine

## 2016-12-30 ENCOUNTER — Encounter: Payer: Self-pay | Admitting: Intensive Care

## 2016-12-30 DIAGNOSIS — B9689 Other specified bacterial agents as the cause of diseases classified elsewhere: Secondary | ICD-10-CM | POA: Diagnosis not present

## 2016-12-30 DIAGNOSIS — O8620 Urinary tract infection following delivery, unspecified: Secondary | ICD-10-CM | POA: Diagnosis not present

## 2016-12-30 DIAGNOSIS — G8918 Other acute postprocedural pain: Secondary | ICD-10-CM

## 2016-12-30 DIAGNOSIS — R319 Hematuria, unspecified: Secondary | ICD-10-CM

## 2016-12-30 DIAGNOSIS — R1032 Left lower quadrant pain: Secondary | ICD-10-CM | POA: Diagnosis present

## 2016-12-30 DIAGNOSIS — N390 Urinary tract infection, site not specified: Secondary | ICD-10-CM

## 2016-12-30 LAB — COMPREHENSIVE METABOLIC PANEL
ALK PHOS: 136 U/L — AB (ref 38–126)
ALT: 60 U/L — ABNORMAL HIGH (ref 14–54)
ANION GAP: 8 (ref 5–15)
AST: 46 U/L — ABNORMAL HIGH (ref 15–41)
Albumin: 2.6 g/dL — ABNORMAL LOW (ref 3.5–5.0)
BUN: 13 mg/dL (ref 6–20)
CALCIUM: 8.5 mg/dL — AB (ref 8.9–10.3)
CO2: 22 mmol/L (ref 22–32)
Chloride: 107 mmol/L (ref 101–111)
Creatinine, Ser: 0.45 mg/dL (ref 0.44–1.00)
GFR calc non Af Amer: >60 mL/min (ref >60)
Glucose, Bld: 98 mg/dL (ref 65–99)
Potassium: 3.6 mmol/L (ref 3.5–5.1)
Sodium: 137 mmol/L (ref 135–145)
TOTAL PROTEIN: 6 g/dL — AB (ref 6.5–8.1)
Total Bilirubin: 0.6 mg/dL (ref 0.3–1.2)

## 2016-12-30 LAB — CBC WITH DIFFERENTIAL/PLATELET
BASOS ABS: 0.1 10*3/uL (ref 0–0.1)
BASOS PCT: 1 %
EOS ABS: 0.1 10*3/uL (ref 0–0.7)
Eosinophils Relative: 2 %
HEMATOCRIT: 24.9 % — AB (ref 35.0–47.0)
HEMOGLOBIN: 7.9 g/dL — AB (ref 12.0–16.0)
Lymphocytes Relative: 21 %
Lymphs Abs: 1.4 10*3/uL (ref 1.0–3.6)
MCH: 23.5 pg — ABNORMAL LOW (ref 26.0–34.0)
MCHC: 31.7 g/dL — ABNORMAL LOW (ref 32.0–36.0)
MCV: 74.1 fL — ABNORMAL LOW (ref 80.0–100.0)
MONO ABS: 0.6 10*3/uL (ref 0.2–0.9)
Monocytes Relative: 9 %
NEUTROS ABS: 4.3 10*3/uL (ref 1.4–6.5)
NEUTROS PCT: 67 %
Platelets: 238 10*3/uL (ref 150–440)
RBC: 3.36 MIL/uL — ABNORMAL LOW (ref 3.80–5.20)
RDW: 19.2 % — AB (ref 11.5–14.5)
WBC: 6.4 10*3/uL (ref 3.6–11.0)

## 2016-12-30 LAB — URINALYSIS, COMPLETE (UACMP) WITH MICROSCOPIC
BILIRUBIN URINE: NEGATIVE
GLUCOSE, UA: NEGATIVE mg/dL
Ketones, ur: NEGATIVE mg/dL
LEUKOCYTES UA: NEGATIVE
NITRITE: NEGATIVE
PH: 5 (ref 5.0–8.0)
Protein, ur: 100 mg/dL — AB
SPECIFIC GRAVITY, URINE: 1.018 (ref 1.005–1.030)

## 2016-12-30 MED ORDER — IOPAMIDOL (ISOVUE-300) INJECTION 61%
15.0000 mL | INTRAVENOUS | Status: AC
Start: 2016-12-30 — End: 2016-12-31
  Administered 2016-12-30 – 2016-12-31 (×2): 15 mL via ORAL

## 2016-12-30 MED ORDER — PIPERACILLIN-TAZOBACTAM 3.375 G IVPB 30 MIN
3.3750 g | Freq: Once | INTRAVENOUS | Status: AC
  Administered 2016-12-30: 3.375 g via INTRAVENOUS
  Filled 2016-12-30: qty 50

## 2016-12-30 MED ORDER — VANCOMYCIN HCL IN DEXTROSE 1-5 GM/200ML-% IV SOLN
1000.0000 mg | Freq: Once | INTRAVENOUS | Status: AC
  Administered 2016-12-31: 1000 mg via INTRAVENOUS
  Filled 2016-12-30: qty 200

## 2016-12-30 NOTE — ED Provider Notes (Signed)
Dragon is dead  CC;  abd pain  HPI pt had emergency csection for delivery after admission 4/16. Comes in today w. Increasing abd pain and redness on abd wall. Some bleeding as was untaping drains to pull them.  Bleeding is minor and has stopped. No fever no n/v.  bABY DOING WELL.   pmh   OBESITY W/ PREGNANCY C SECTION  Sh MARRIED   ROS NEG 10 SYSTEMS EXCEPT ABD PAIN   pe WDWN f ALERT OX3 L OOKS WELL.  hEAD ncat  Eyes PERRL EOMI  MOUTH NL MOIST NO ERYTHEMS   NECK NO STRIDOR  HT RRR - MUR   LUNGS CLR   ABD TENDER TO PALP AND PERCUSS LLQ. APPARENT CELLULITIS RED/WARM/TRENDER IN MID LOWER ABD.  EXT SL TR EDEMA.  Pt signed out to Dr York Cerise pending ct report  Diagnosis abdominal pain   Arnaldo Natal, MD 12/31/16 [phone]

## 2016-12-30 NOTE — ED Triage Notes (Signed)
PAtient arrived by EMS from home. 5 days post op C-section. Patient reported her doctor told her she could pull out her drainage tubes today and when she went to do this, she had bleeding. After speaking with her doctor on the phone, he told her to come into the ED. EMS vitals 96RA, 92HR, 145/75b/p

## 2016-12-31 ENCOUNTER — Emergency Department: Payer: Medicaid Other

## 2016-12-31 ENCOUNTER — Encounter: Payer: Self-pay | Admitting: Radiology

## 2016-12-31 MED ORDER — CEPHALEXIN 500 MG PO CAPS: 500.0000 mg | ORAL_CAPSULE | Freq: Two times a day (BID) | ORAL | 0 refills | Status: DC

## 2016-12-31 MED ORDER — IOPAMIDOL (ISOVUE-300) INJECTION 61%
100.0000 mL | Freq: Once | INTRAVENOUS | Status: AC | PRN
  Administered 2016-12-31: 100 mL via INTRAVENOUS

## 2016-12-31 NOTE — Discharge Instructions (Signed)
You have been seen in the Emergency Department (ED) for abdominal pain.  Your evaluation did not identify a clear cause of your symptoms but was generally reassuring.  Your drain was removed.  You may have a urinary tract infection so you were given antibiotics.  Please follow up as instructed above regarding today?s emergent visit and the symptoms that are bothering you.  Return to the ED if your abdominal pain worsens or fails to improve, you develop bloody vomiting, bloody diarrhea, you are unable to tolerate fluids due to vomiting, fever greater than 101, or other symptoms that concern you.

## 2016-12-31 NOTE — ED Notes (Signed)
CT made aware that patient had finished contrast.  

## 2016-12-31 NOTE — ED Provider Notes (Signed)
Clinical Course as of Dec 31 245  Sat Dec 31, 2016  0036 Assuming care from Dr. Darnelle Catalan.  In short, Amber Duffy is a 26 y.o. female with a chief complaint of abdominal pain / post-op problem.  Refer to the original H&P for additional details.  The current plan of care is to follow up CT scan, reassess, and touch base with Dr. Tiburcio Pea.   [CF]  0239 CT scan generally unremarkable with anticipated postop changes as per the radiologist report.  I reassessed the patient and she states that she is ready to go, she just wanted the drain taken out because it started to bleed when she tried to take it out at home.  She is diffusely tender throughout the abdomen but mildly so and without any specific focal tenderness.  We will take out the drain as she was instructed to do and I will also prescribe her a course of antibiotics for what appears to be a urinary tract infection although a urine culture is pending.  She plans to call her OB/GYN first thing in the morning for follow-up. CT Abdomen Pelvis W Contrast [CF]    Clinical Course User Index [CF] Loleta Rose, MD      Loleta Rose, MD 12/31/16 [phone]

## 2017-01-01 LAB — URINE CULTURE

## 2017-01-05 ENCOUNTER — Telehealth: Payer: Self-pay

## 2017-01-05 ENCOUNTER — Encounter: Payer: Self-pay | Admitting: Obstetrics and Gynecology

## 2017-01-05 NOTE — Telephone Encounter (Signed)
FMLA/DISABILITY form for pt's [employer]. Filled out for [facility] and given to [place] for processing.

## 2017-01-06 ENCOUNTER — Telehealth: Payer: Self-pay | Admitting: Obstetrics and Gynecology

## 2017-01-06 ENCOUNTER — Ambulatory Visit (INDEPENDENT_AMBULATORY_CARE_PROVIDER_SITE_OTHER): Payer: Medicaid Other | Admitting: Obstetrics and Gynecology

## 2017-01-06 VITALS — BP 106/68 | HR 66 | Wt 202.0 lb

## 2017-01-06 DIAGNOSIS — Z4889 Encounter for other specified surgical aftercare: Secondary | ICD-10-CM

## 2017-01-06 MED ORDER — OXYCODONE-ACETAMINOPHEN 5-325 MG PO TABS: 1.0000 | ORAL_TABLET | Freq: Four times a day (QID) | ORAL | 0 refills | Status: DC | PRN

## 2017-01-06 NOTE — Progress Notes (Signed)
      Postoperative Follow-up Patient presents post op from  Cesarean Section 2weeks ago for 2nd stage arrest.  Subjective: Patient reports some improvement in her preop symptoms. Eating a regular diet without difficulty. Pain is controlled with current analgesics. Medications being used: ibuprofen (OTC) and narcotic analgesics including percocet.  Activity: normal activities of daily living.  Objective: Vitals:   01/06/17 1000  BP: 106/68  Pulse: 66   Gen: NAD Abdomen: soft, non-tender, non-distended, incision healing well Ext: trace pretibial edema, no erythema, no tenderness  Assessment: 26 y.o. s/p 1LTCS stable  Plan: Patient has done well after surgery with no apparent complications.  I have discussed the post-operative course to date, and the expected progress moving forward.  The patient understands what complications to be concerned about.  I will see the patient in routine follow up, or sooner if needed.    Activity plan: No heavy lifting. For full 6 weeks Plans on Mirena for contraception  Amber Duffy 01/06/2017, 10:48 AM

## 2017-01-06 NOTE — Telephone Encounter (Signed)
Pt scheduled her IUD insertion appt at checkout. Pt is coming in on 02/07/17 with Dr. Bonney Aid @ 2:30 pm for Mirena IUD. Thanks TNP

## 2017-01-06 NOTE — Patient Instructions (Signed)

## 2017-01-09 NOTE — Telephone Encounter (Signed)
Noted. Will order to arrive by apt date/time. 

## 2017-02-07 ENCOUNTER — Ambulatory Visit (INDEPENDENT_AMBULATORY_CARE_PROVIDER_SITE_OTHER): Payer: Medicaid Other | Admitting: Obstetrics and Gynecology

## 2017-02-07 ENCOUNTER — Encounter: Payer: Self-pay | Admitting: Obstetrics and Gynecology

## 2017-02-07 DIAGNOSIS — Z30430 Encounter for insertion of intrauterine contraceptive device: Secondary | ICD-10-CM | POA: Diagnosis not present

## 2017-02-07 NOTE — Progress Notes (Signed)
Obstetrics & Gynecology Office Visit   Chief Complaint:  Chief Complaint  Patient presents with  . Postpartum Care  . Contraception    Mirena insert today    History of Present Illness:  Date of delivery: 12/26/2016 Type of delivery: Primary Cesarean section   Pt had the following problems during pregnancy or labor: Emergency or unplanned Cesarean delivery Patient has had what problems since the delivery: No GI, GU, Breast, or psychiatric concerns  Newborn Details:  SINGLETON :  1. Baby's birth weight: 9 lbs., 4 oz.  Maternal Details:  Feeding plan: Breast & Bottle Post partum depression/anxiety noted None Date of last PAP:  01/27/2015 NIL  Review of Systems:10 point review of systems negative unless otherwise noted in HPI  Past Medical History:  Past Medical History:  Diagnosis Date  . Migraine   . Missed abortions 2010  . Obesity (BMI 35.0-39.9 without comorbidity)     Past Surgical History:  Past Surgical History:  Procedure Laterality Date  . CESAREAN SECTION N/A 12/26/2016   Procedure: CESAREAN SECTION;  Surgeon: Vena AustriaAndreas Kaytelyn Glore, MD;  Location: [facility] ORS;  Service: Obstetrics;  Laterality: N/A;  . DILATION AND CURETTAGE OF UTERUS    . DILATION AND EVACUATION N/A 02/26/2015   Procedure: DILATATION AND EVACUATION;  Surgeon: Nadara Mustardobert P Harris, MD;  Location: [facility] ORS;  Service: Gynecology;  Laterality: N/A;  . DILATION AND EVACUATION N/A 05/26/2015   Procedure: DILATATION AND EVACUATION;  Surgeon: [place] Bingharlie Pickens, MD;  Location: [facility] ORS;  Service: Gynecology;  Laterality: N/A;    Gynecologic History: Patient's last menstrual period was 02/07/2017 (exact date).  Obstetric History: [id number]  Family History:  Family History  Problem Relation Age of Onset  . Cancer Maternal Aunt        unknown primary  . Breast cancer Neg Hx   . Ovarian cancer Neg Hx     Social History:  Social History   Social History  . Marital status: Single    Spouse name: N/A    . Number of children: 1  . Years of education: N/A   Occupational History  . Not on file.   Social History Main Topics  . Smoking status: Never Smoker  . Smokeless tobacco: Never Used  . Alcohol use No  . Drug use: No  . Sexual activity: Yes   Other Topics Concern  . Not on file   Social History Narrative  . No narrative on file    Allergies:  No Known Allergies  Medications: Prior to Admission medications   Not on File    Physical Exam Vitals:  Vitals:   02/07/17 1446  BP: (!) 92/54  Pulse: 62   Patient's last menstrual period was 02/07/2017 (exact date).  General: NAD HEENT: normocephalic, anicteric Pulmonary: No increased work of breathing  hepatomegaly, splenomegaly or masses palpable. No evidence of hernia  Genitourinary:  External: Normal external female genitalia.  Normal urethral meatus, normal  Bartholin's and Skene's glands.    Vagina: Normal vaginal mucosa, no evidence of prolapse.    Cervix: Grossly normal in appearance, no bleeding  Uterus: Non-enlarged, mobile, normal contour.  No CMT  Adnexa: ovaries non-enlarged, no adnexal masses  Rectal: deferred  Lymphatic: no evidence of inguinal lymphadenopathy Extremities: no edema, erythema, or tenderness Neurologic: Grossly intact Psychiatric: mood appropriate, affect full  Female chaperone present for pelvic portions of the physical exam   GYNECOLOGY OFFICE PROCEDURE NOTE  Amber MangleSonia Derise is a 26 y.o. [id number] here for Mirena IUD  insertion. No GYN concerns.  Last pap smear was on 01/27/15 and was normal.  The patient is currently using nothing for contraception and her LMP is Patient's last menstrual period was 02/07/2017 (exact date)..  The indication for her IUD is contraception/cycle control.  Negative predictive value of 99%-100% - < OR = 7 days from the onset of a normal menstrual cycle - has not had sexual intercourse since the start of the last menstrual cycle - has correctly and consistently  used a reliable form of contraception - < or = 7 days from an induced abortion - is within 4 weeks postpartum - is exclusively breast feeding (>85% of feeds), amenorrheic, and <6 months postpartum   "[place] Selected Practice Recommendations for Contraceptive Use", April 10, 2015   IUD Insertion Procedure Note Patient identified, informed consent performed, consent signed.   Discussed risks of irregular bleeding, cramping, infection, malpositioning, expulsion or uterine perforation of the IUD (1:1000 placements)  which may require further procedure such as laparoscopy.  IUD while effective at preventing pregnancy do not prevent transmission of sexually transmitted diseases and use of barrier methods for this purpose was discussed. Time out was performed.  Urine pregnancy test negative.  Speculum placed in the vagina.  Cervix visualized.  Cleaned with Betadine x 2.  Grasped anteriorly with a single tooth tenaculum.  Uterus sounded to 8 cm. IUD placed per manufacturer's recommendations.  Strings trimmed to 3 cm. Tenaculum was removed, good hemostasis noted.  Patient tolerated procedure well.   Patient was given post-procedure instructions.  She was advised to have backup contraception for one week.  Patient was also asked to check IUD strings periodically and follow up in 6 weeks for IUD check.  Assessment: 26 y.o. [id number] No problem-specific Assessment & Plan notes found for this encounter.   Plan: Problem List Items Addressed This Visit    None      1) Pap smear - up to date 2) Contraception - Mirena placed today 3) F/U in 6 weeks for IUD string check

## 2017-02-07 NOTE — Patient Instructions (Signed)

## 2017-03-24 ENCOUNTER — Ambulatory Visit: Payer: Medicaid Other | Admitting: Obstetrics and Gynecology

## 2017-06-12 ENCOUNTER — Encounter: Payer: Self-pay | Admitting: Obstetrics and Gynecology

## 2017-06-12 ENCOUNTER — Ambulatory Visit (INDEPENDENT_AMBULATORY_CARE_PROVIDER_SITE_OTHER): Payer: Medicaid Other | Admitting: Obstetrics and Gynecology

## 2017-06-12 VITALS — BP 104/68 | HR 76 | Ht 63.0 in | Wt 199.0 lb

## 2017-06-12 DIAGNOSIS — Z975 Presence of (intrauterine) contraceptive device: Secondary | ICD-10-CM

## 2017-06-12 DIAGNOSIS — N921 Excessive and frequent menstruation with irregular cycle: Secondary | ICD-10-CM

## 2017-06-12 DIAGNOSIS — Z30431 Encounter for routine checking of intrauterine contraceptive device: Secondary | ICD-10-CM | POA: Diagnosis not present

## 2017-06-12 DIAGNOSIS — Z113 Encounter for screening for infections with a predominantly sexual mode of transmission: Secondary | ICD-10-CM

## 2017-06-12 DIAGNOSIS — N898 Other specified noninflammatory disorders of vagina: Secondary | ICD-10-CM

## 2017-06-12 LAB — POCT WET PREP WITH KOH
CLUE CELLS WET PREP PER HPF POC: NEGATIVE
KOH Prep POC: NEGATIVE
TRICHOMONAS UA: NEGATIVE
Yeast Wet Prep HPF POC: NEGATIVE

## 2017-06-12 NOTE — Progress Notes (Deleted)
   Chief Complaint  Patient presents with  . Contraception    IUD removal and discuss birth control     History of Present Illness:  Lynnix Schoneman is a 26 y.o. that had a Mirena IUD placed approximately 4 months ago. Since that time, she denies dyspareunia, pelvic pain, non-menstrual bleeding, vaginal d/c, heavy bleeding.    BP 104/68   Pulse 76   Ht  (1.6 m)   Wt 199 lb (90.3 kg)   BMI 35.25 kg/m   Pelvic exam:  Two IUD strings {DESC; PRESENT/ABSENT:17923::"present"} seen coming from the cervical os. EGBUS, vaginal vault and cervix: within normal limits  IUD Removal Strings of IUD identified and grasped.  IUD removed without problem with ring forceps.  Pt tolerated this well.  IUD noted to be intact.  Assessment:  No diagnosis found.    Plan: IUD removed and plan for contraception is {PLAN CONTRACEPTION:313102}. She was amenable to this plan.  Thelma Viana B. Chibuike Fleek, PA-C 06/12/2017 3:38 PM

## 2017-06-12 NOTE — Addendum Note (Signed)
Addended by: Althea Grimmer B on: 06/12/2017 04:51 PM   Modules accepted: Orders

## 2017-06-12 NOTE — Progress Notes (Addendum)
Chief Complaint  Patient presents with  . Contraception    IUD removal and discuss birth control    HPI:      [name] is a 26 y.o. [id number] who LMP was No LMP recorded. Patient is not currently having periods (Reason: IUD)., presents today for IUD removal. She had the Mirena placed at 6 wk PP check about 4 months ago. She has light daily spotting and has noticed large clots of d/c with odor, no irritation. She had some pelvic pain/cramping initially but those sx resolved. She is concerned about d/c and STD. She is sex active with husband. She is interested in different [facility]. She has a hx of migraines with and without aura.   Past Medical History:  Diagnosis Date  . Migraine    with aura  . Missed abortions 2010  . Obesity (BMI 35.0-39.9 without comorbidity)     Past Surgical History:  Procedure Laterality Date  . CESAREAN SECTION N/A 12/26/2016   Procedure: CESAREAN SECTION;  Surgeon: Vena Austria, MD;  Location: [facility] ORS;  Service: Obstetrics;  Laterality: N/A;  . DILATION AND CURETTAGE OF UTERUS    . DILATION AND EVACUATION N/A 02/26/2015   Procedure: DILATATION AND EVACUATION;  Surgeon: Nadara Mustard, MD;  Location: [facility] ORS;  Service: Gynecology;  Laterality: N/A;  . DILATION AND EVACUATION N/A 05/26/2015   Procedure: DILATATION AND EVACUATION;  Surgeon: [place] Bing, MD;  Location: [facility] OR[facility];  Service: Gynecology;  Laterality: N/A;    Family History  Problem Relation Age of Onset  . Cancer Maternal Aunt        unknown primary  . Breast cancer Neg Hx   . Ovarian cancer Neg Hx     Social History   Social History  . Marital status: Single    Spouse name: N/A  . Number of children: 1  . Years of education: N/A   Occupational History  . Not on file.   Social History Main Topics  . Smoking status: Never Smoker  . Smokeless tobacco: Never Used  . Alcohol use No  . Drug use: No  . Sexual activity: Yes    Birth control/ protection: IUD   Other Topics  Concern  . Not on file   Social History Narrative  . No narrative on file    No current outpatient prescriptions on file.   ROS:  Review of Systems  Constitutional: Negative for fever.  Gastrointestinal: Negative for blood in stool, constipation, diarrhea, nausea and vomiting.  Genitourinary: Positive for menstrual problem and vaginal discharge. Negative for dyspareunia, dysuria, flank pain, frequency, hematuria, urgency, vaginal bleeding and vaginal pain.  Musculoskeletal: Negative for back pain.  Skin: Negative for rash.     OBJECTIVE:   Vitals:  BP 104/68   Pulse 76   Ht  (1.6 m)   Wt 199 lb (90.3 kg)   BMI 35.25 kg/m   Physical Exam  Constitutional: She is oriented to person, place, and time and well-developed, well-nourished, and in no distress. Vital signs are normal.  Genitourinary: Vagina normal, uterus normal, cervix normal, right adnexa normal, left adnexa normal and vulva normal. Uterus is not enlarged. Cervix exhibits no motion tenderness and no tenderness. Right adnexum displays no mass and no tenderness. Left adnexum displays no mass and no tenderness. Vulva exhibits no erythema, no exudate, no lesion, no rash and no tenderness. Vagina exhibits no lesion.  Genitourinary Comments: CX: IUD STRINGS IN PLACE  Neurological: She is oriented to person,  place, and time.  Vitals reviewed.   Results: Results for orders placed or performed in visit on 06/12/17 (from the past 24 hour(s))  POCT Wet Prep with KOH     Status: Normal   Collection Time: 06/12/17  4:04 PM  Result Value Ref Range   Trichomonas, UA Negative    Clue Cells Wet Prep HPF POC neg    Epithelial Wet Prep HPF POC  Few, Moderate, Many, Too numerous to count   Yeast Wet Prep HPF POC neg    Bacteria Wet Prep HPF POC  Few   RBC Wet Prep HPF POC     WBC Wet Prep HPF POC     KOH Prep POC Negative Negative     Assessment/Plan: Vaginal discharge - Neg wet prep/exam. Check gon/chlam. If neg,  reassurance.  - Plan: POCT Wet Prep with KOH  Breakthrough bleeding with IUD - Discussed other prog only BC options, but she would have 3 months adjustment period. Pt will follow sx for a couple more months with IUD.  - Plan: Chlamydia/Gonococcus/Trichomonas, NAA  Encounter for routine checking of intrauterine contraceptive device (IUD) - IUD in place. Pt can only have prog only options. Doesn't want POPs or nexplanon.   Screening for STD (sexually transmitted disease) - Plan: Chlamydia/Gonococcus/Trichomonas, NAA     Return if symptoms worsen or fail to improve.  Amber B. Copland, PA-C 06/12/2017 4:51 PM

## 2017-06-16 LAB — CHLAMYDIA/GONOCOCCUS/TRICHOMONAS, NAA
CHLAMYDIA BY NAA: NEGATIVE
Gonococcus by NAA: NEGATIVE
Trich vag by NAA: NEGATIVE

## 2018-02-12 ENCOUNTER — Encounter: Payer: Self-pay | Admitting: Obstetrics and Gynecology

## 2018-02-12 ENCOUNTER — Ambulatory Visit (INDEPENDENT_AMBULATORY_CARE_PROVIDER_SITE_OTHER): Payer: Medicaid Other | Admitting: Obstetrics and Gynecology

## 2018-02-12 DIAGNOSIS — Z01419 Encounter for gynecological examination (general) (routine) without abnormal findings: Secondary | ICD-10-CM

## 2018-02-12 DIAGNOSIS — Z0000 Encounter for general adult medical examination without abnormal findings: Secondary | ICD-10-CM | POA: Diagnosis not present

## 2018-02-12 DIAGNOSIS — Z124 Encounter for screening for malignant neoplasm of cervix: Secondary | ICD-10-CM | POA: Diagnosis not present

## 2018-02-12 NOTE — Progress Notes (Signed)
Gynecology Annual Exam   PCP: [facility], [place]  Chief Complaint:  Chief Complaint  Patient presents with  . Gynecologic Exam    History of Present Illness: Patient is a 27 y.o. [id number] presents for annual exam. The patient has no complaints today.   LMP: No LMP recorded. (Menstrual status: IUD). Absent secondary to Mirena IUD  The patient is sexually active. She currently uses IUD for contraception. She denies dyspareunia.  The patient does perform self breast exams.  There is no notable family history of breast or ovarian cancer in her family.  The patient wears seatbelts: yes.   The patient has regular exercise: not asked.    The patient denies current symptoms of depression.    Review of Systems: Review of Systems  Constitutional: Negative for chills and fever.  HENT: Negative for congestion.   Respiratory: Negative for cough and shortness of breath.   Cardiovascular: Negative for chest pain and palpitations.  Gastrointestinal: Negative for abdominal pain, constipation, diarrhea, heartburn, nausea and vomiting.  Genitourinary: Negative for dysuria, frequency and urgency.  Skin: Negative for itching and rash.  Neurological: Negative for dizziness and headaches.  Endo/Heme/Allergies: Negative for polydipsia.  Psychiatric/Behavioral: Negative for depression.    Past Medical History:  Past Medical History:  Diagnosis Date  . Migraine    with aura  . Missed abortions 2010  . Obesity (BMI 35.0-39.9 without comorbidity)     Past Surgical History:  Past Surgical History:  Procedure Laterality Date  . CESAREAN SECTION N/A 12/26/2016   Procedure: CESAREAN SECTION;  Surgeon: Vena Austria, MD;  Location: [facility] ORS;  Service: Obstetrics;  Laterality: N/A;  . DILATION AND CURETTAGE OF UTERUS    . DILATION AND EVACUATION N/A 02/26/2015   Procedure: DILATATION AND EVACUATION;  Surgeon: Nadara Mustard, MD;  Location: [facility] ORS;  Service: Gynecology;  Laterality:  N/A;  . DILATION AND EVACUATION N/A 05/26/2015   Procedure: DILATATION AND EVACUATION;  Surgeon: [place] Bing, MD;  Location: [facility] ORS;  Service: Gynecology;  Laterality: N/A;    Gynecologic History:  No LMP recorded. (Menstrual status: IUD). Contraception: IUD Last Pap: Results were:01/27/2015 no abnormalities   Obstetric History: [id number]  Family History:  Family History  Problem Relation Age of Onset  . Cancer Maternal Aunt        unknown primary  . Breast cancer Neg Hx   . Ovarian cancer Neg Hx     Social History:  Social History   Socioeconomic History  . Marital status: Single    Spouse name: Not on file  . Number of children: 1  . Years of education: Not on file  . Highest education level: Not on file  Occupational History  . Not on file  Social Needs  . Financial resource strain: Not on file  . Food insecurity:    Worry: Not on file    Inability: Not on file  . Transportation needs:    Medical: Not on file    Non-medical: Not on file  Tobacco Use  . Smoking status: Never Smoker  . Smokeless tobacco: Never Used  Substance and Sexual Activity  . Alcohol use: No  . Drug use: No  . Sexual activity: Yes    Birth control/protection: IUD  Lifestyle  . Physical activity:    Days per week: Not on file    Minutes per session: Not on file  . Stress: Not on file  Relationships  . Social connections:    Talks  on phone: Not on file    Gets together: Not on file    Attends religious service: Not on file    Active member of club or organization: Not on file    Attends meetings of clubs or organizations: Not on file    Relationship status: Not on file  . Intimate partner violence:    Fear of current or ex partner: Not on file    Emotionally abused: Not on file    Physically abused: Not on file    Forced sexual activity: Not on file  Other Topics Concern  . Not on file  Social History Narrative  . Not on file    Allergies:  No Known  Allergies  Medications: Prior to Admission medications   Medication Sig Start Date End Date Taking? Authorizing Provider  levonorgestrel (MIRENA) 20 MCG/24HR IUD 1 each by Intrauterine route once.   Yes [provider]    Physical Exam Vitals: Blood pressure 100/60, height 5\' 3"  (1.6 m), weight 201 lb (91.2 kg), not currently breastfeeding.  General: NAD HEENT: normocephalic, anicteric Thyroid: no enlargement, no palpable nodules Pulmonary: No increased work of breathing, CTAB Cardiovascular: RRR, distal pulses 2+ Breast: Breast symmetrical, no tenderness, no palpable nodules or masses, no skin or nipple retraction present, no nipple discharge.  No axillary or supraclavicular lymphadenopathy. Abdomen: NABS, soft, non-tender, non-distended.  Umbilicus without lesions.  No hepatomegaly, splenomegaly or masses palpable. No evidence of hernia  Genitourinary:  External: Normal external female genitalia.  Normal urethral meatus, normal Bartholin's and Skene's glands.    Vagina: Normal vaginal mucosa, no evidence of prolapse.    Cervix: Grossly normal in appearance, no bleeding  Uterus: Non-enlarged, mobile, normal contour.  No CMT, IUD strings visualized  Adnexa: ovaries non-enlarged, no adnexal masses  Rectal: deferred  Lymphatic: no evidence of inguinal lymphadenopathy Extremities: no edema, erythema, or tenderness Neurologic: Grossly intact Psychiatric: mood appropriate, affect full  Female chaperone present for pelvic and breast  portions of the physical exam    Assessment: 27 y.o. [id number] routine annual exam  Plan: Problem List Items Addressed This Visit    None    Visit Diagnoses    Screening for malignant neoplasm of cervix       Relevant Orders   Pap IG w/ reflex to HPV when ASC-U   Encounter for gynecological examination without abnormal finding          2) STI screening  was notoffered and therefore not obtained  2)  ASCCP guidelines and rational  discussed.  Patient opts for every 3 years screening interval  3) Contraception - the patient is currently using  IUD.  She is happy with her current form of contraception and plans to continue  4) Routine healthcare maintenance including cholesterol, diabetes screening discussed managed by PCP  5) Return in 1 year (on 02/13/2019) for annual.   Vena AustriaAndreas Pius Byrom, MD, Merlinda FrederickFACOG [facility], [facility] 02/12/2018, 3:13 PM

## 2018-02-14 LAB — PAP IG W/ RFLX HPV ASCU: PAP Smear Comment: 0

## 2018-03-02 ENCOUNTER — Encounter: Payer: Self-pay | Admitting: Obstetrics and Gynecology

## 2018-08-02 ENCOUNTER — Ambulatory Visit: Payer: Medicaid Other | Admitting: Obstetrics & Gynecology

## 2018-10-17 IMAGING — CT CT ABD-PELV W/ CM
2 of 5 series · 15 of 46 positions shown, 17 images · IV contrast (APPLIED)
Comparison: None available.

CLINICAL DATA: Initial evaluation for acute abdominal pain,
postoperative cellulitis.

EXAM:
CT ABDOMEN AND PELVIS WITH CONTRAST
TECHNIQUE: Multidetector CT imaging of the abdomen and pelvis was performed
using the standard protocol following bolus administration of
intravenous contrast.
CONTRAST:  100mL K2GWPC-SKK IOPAMIDOL (K2GWPC-SKK) INJECTION 61%

[Series 2: routine abd/pel with · axial · 0.91mm/px · z∈[-1062,-627]mm · 12 of 101 slices shown, 14 images]
[im 7/101  soft-tissue]
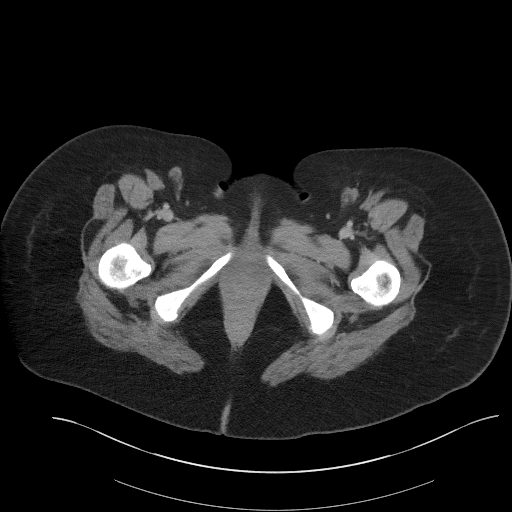
[im 7/101  bone]
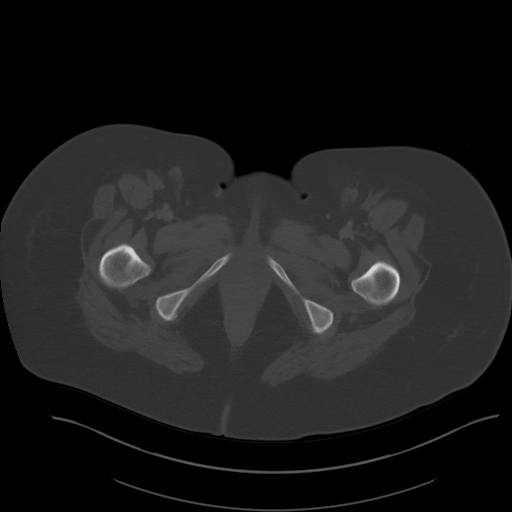
[im 14/101  soft-tissue]
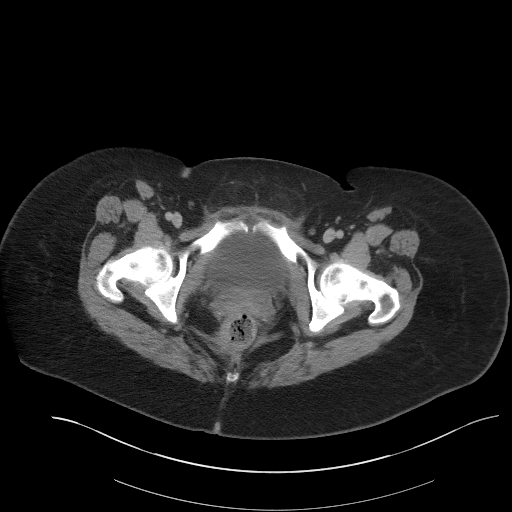
[im 21/101  soft-tissue]
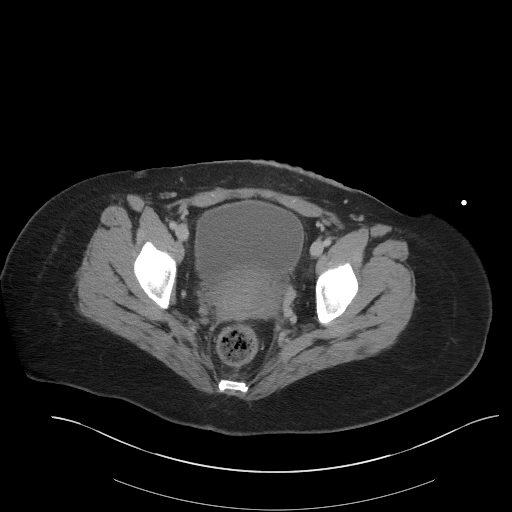
[im 34/101  soft-tissue]
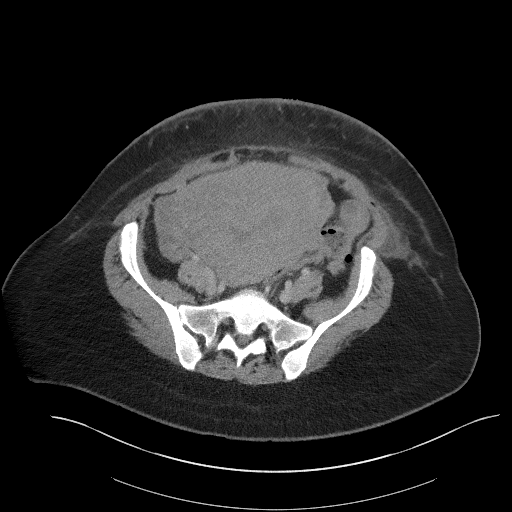
[im 41/101  soft-tissue]
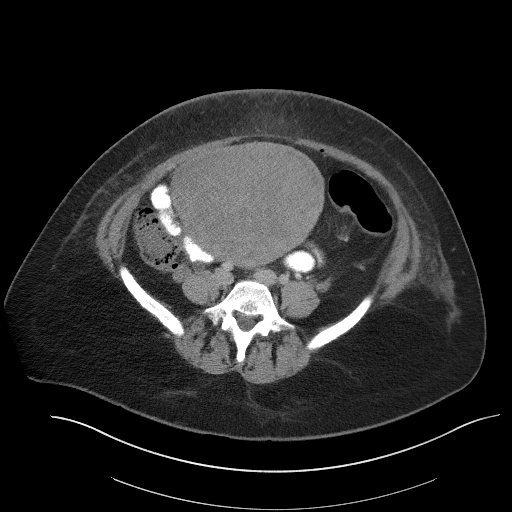
[im 47/101  soft-tissue]
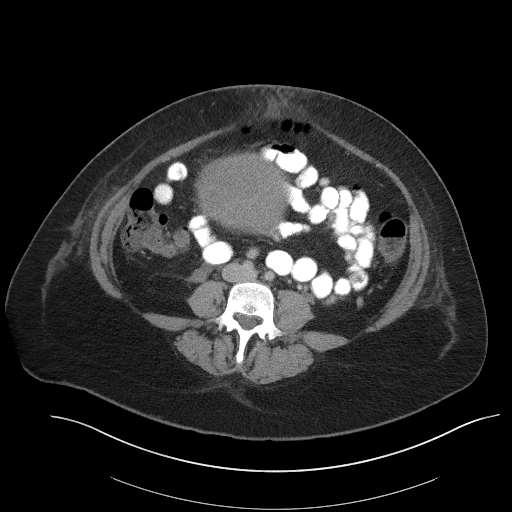
[im 54/101  soft-tissue]
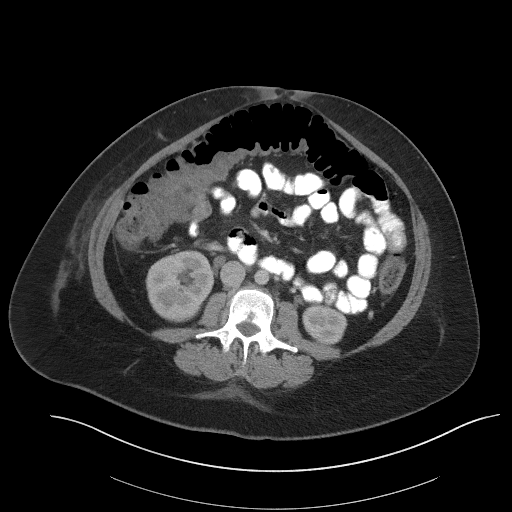
[im 61/101  soft-tissue]
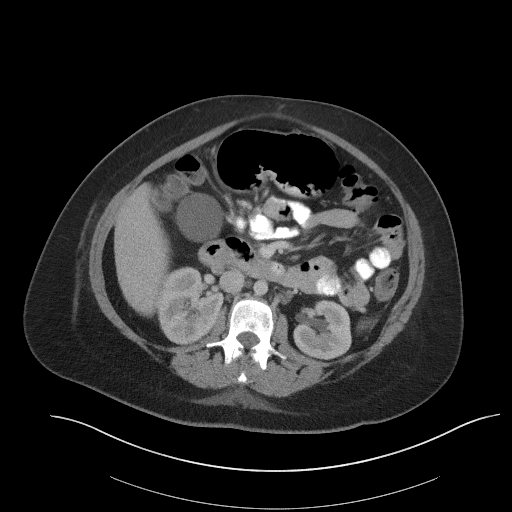
[im 67/101  soft-tissue]
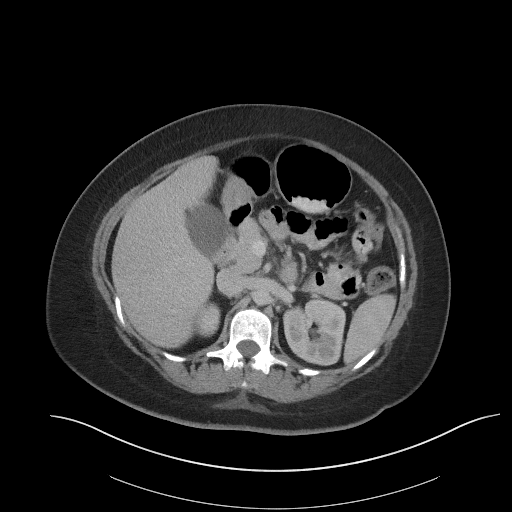
[im 67/101  bone]
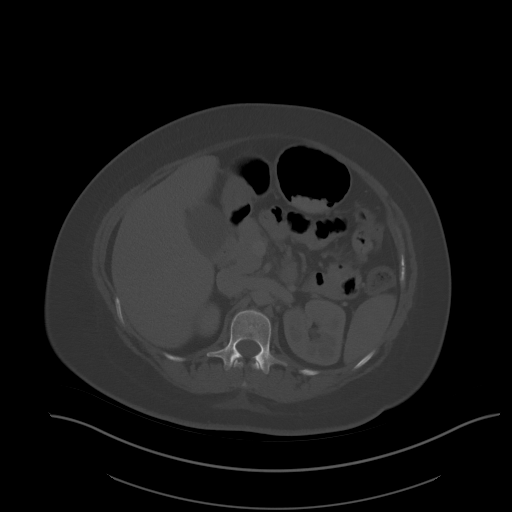
[im 81/101  soft-tissue]
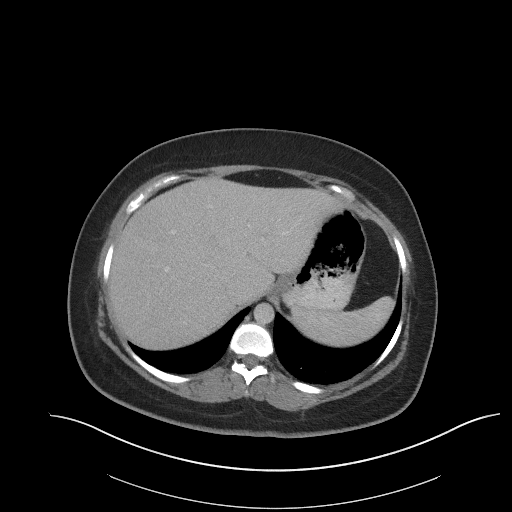
[im 87/101  soft-tissue]
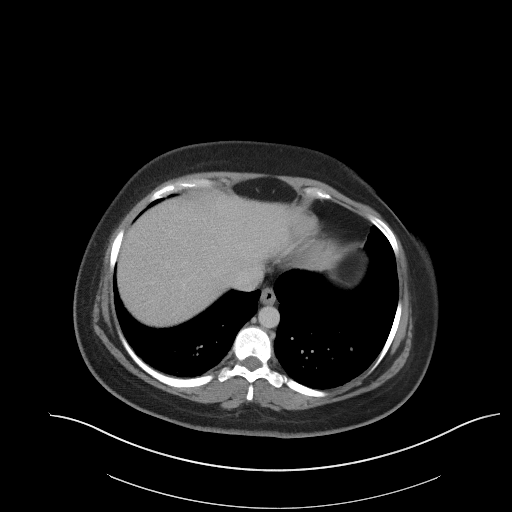
[im 94/101  soft-tissue]
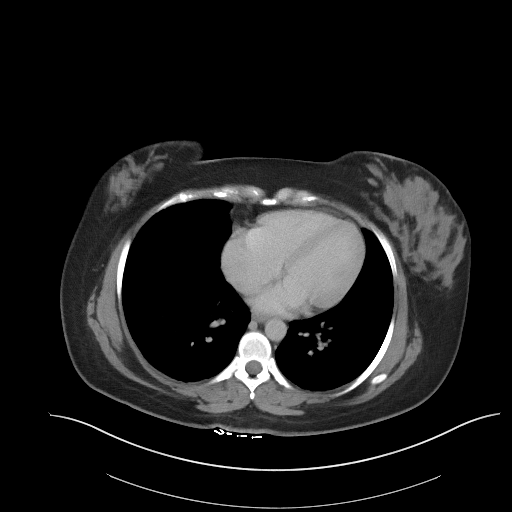

[Series 5: coronal st · coronal · 0.78mm/px · 3 of 107 slices shown]
[im 36/107  soft-tissue]
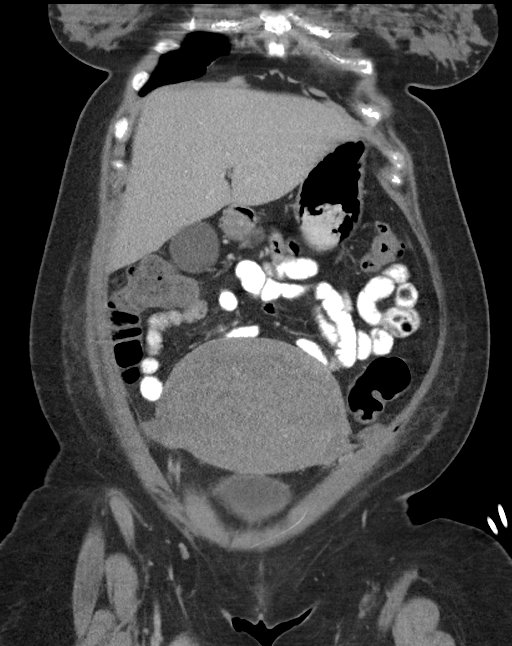
[im 48/107  soft-tissue]
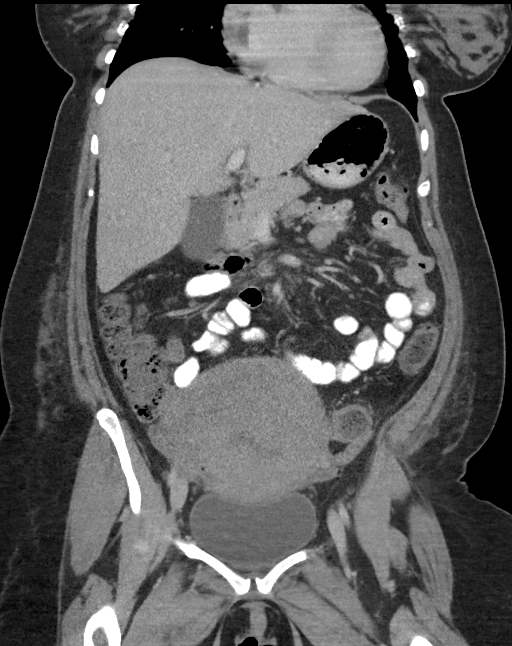
[im 59/107  soft-tissue]
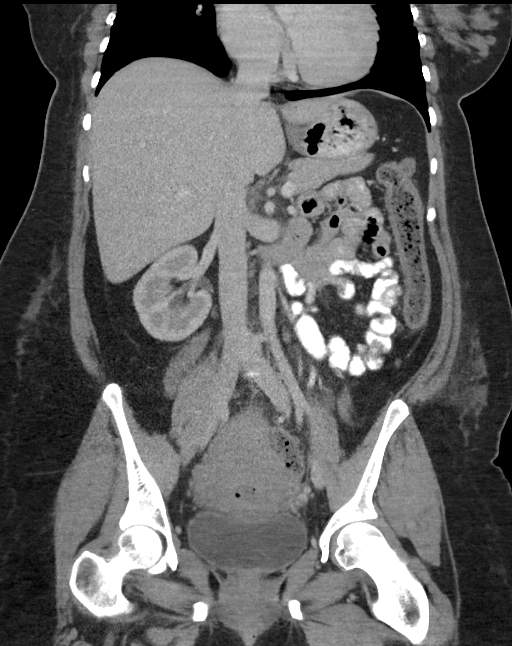

[15 of 46 positions shown; findings below may reference images not displayed]

FINDINGS: Lower chest: Mild bibasilar atelectasis. Visualized lung bases are
otherwise clear.

Hepatobiliary: Liver demonstrates a normal contrast enhanced
appearance. Gallbladder normal. No biliary dilatation.

Pancreas: Pancreas within normal limits.

Spleen: Spleen within normal limits.

Adrenals/Urinary Tract: Adrenal glands are normal. Kidneys equal in
size with symmetric enhancement. No nephrolithiasis, hydronephrosis,
or focal enhancing renal mass. No hydroureter. Bladder within normal
limits.

Stomach/Bowel: Stomach within normal limits. No evidence for bowel
obstruction. Appendix normal. No acute inflammatory changes seen
about the bowels.

Vascular/Lymphatic: Normal intravascular enhancement seen throughout
the intra-abdominal aorta and its branch vessels. No adenopathy.

Reproductive: Enlarged uterus related to recent delivery. Scattered
foci of gas within the lower uterine segment and vaginal vault,
suspected be postoperative. Scattered fluid and gas also present
within the vaginal vault, also likely postoperative. Ovaries within
normal limits.

Other: Few scattered foci of free air present within the pelvis,
like related to recent C-section. 2 additional foci of gas track
along the rectus abdominus musculature superiorly. Soft tissue
stranding within the pelvis also likely postoperative. No discrete
hematoma. No other free fluid.

Musculoskeletal: Soft tissue stranding with emphysema present within
knee anterior aspect of the lower abdomen/ pelvis, consistent with
history of prior C-section. Changes likely largely postoperative in
nature, although superimposed infection not row excluded. No
discrete or drainable fluid collection identified.

No acute osseous abnormality. No worrisome lytic or blastic osseous
lesions.
IMPRESSION: 1. Postoperative changes from recent C-section. Few scattered foci
of free air within the pelvis felt to be consistent with normal
expected postoperative changes. No hematoma or other complication
identified. Hazy inflammatory stranding within the overlying
subcutaneous fat of the lower abdomen/pelvis felt to likely be
postoperative in nature, although superimposed infection could be
considered in the correct clinical setting. No discrete abscess or
drainable fluid collection.
2. Enlarged uterus, consistent with recent delivery. Scattered fluid
and gas within the lower uterus extending into the cervix and
vaginal vault likely postoperative in nature.
3. No other acute intra-abdominal or pelvic process.

## 2018-11-13 ENCOUNTER — Other Ambulatory Visit (INDEPENDENT_AMBULATORY_CARE_PROVIDER_SITE_OTHER): Payer: Medicaid Other

## 2018-11-13 ENCOUNTER — Ambulatory Visit (INDEPENDENT_AMBULATORY_CARE_PROVIDER_SITE_OTHER): Payer: Medicaid Other | Admitting: Obstetrics and Gynecology

## 2018-11-13 ENCOUNTER — Encounter: Payer: Self-pay | Admitting: Obstetrics and Gynecology

## 2018-11-13 VITALS — BP 90/64 | HR 86 | Ht 65.0 in | Wt 204.0 lb

## 2018-11-13 DIAGNOSIS — R102 Pelvic and perineal pain: Secondary | ICD-10-CM

## 2018-11-13 DIAGNOSIS — Z30431 Encounter for routine checking of intrauterine contraceptive device: Secondary | ICD-10-CM

## 2018-11-13 NOTE — Patient Instructions (Signed)
I value your feedback and entrusting us with your care. If you get a [place] patient survey, I would appreciate you taking the time to let us know about your experience today. Thank you! 

## 2018-11-13 NOTE — Progress Notes (Signed)
[facility], [place]   Chief Complaint  Patient presents with  . Pelvic Pain    center pelvic area x a few months    HPI:      [name] is a 28 y.o. [id number] who LMP was No LMP recorded. (Menstrual status: IUD)., presents today for pelvic pain intermittently since 12/19. Pain is crampy, sometimes sharp, comes and goes several times a wk. Tylenol improves sx temporarily. Mirena placed 5/18, pt is amenorrheic. No BTB. Pt has constipation but this isn't new for pt. Denies any vag or urin sx. She is sex active, no new partners. No dyspareunia, postcoital bleeding. No fevers. FH pancreatic vs ovar cancer.  Current on annual.    Past Medical History:  Diagnosis Date  . Family history of pancreatic cancer   . Migraine    with aura  . Missed abortions 2010  . Obesity (BMI 35.0-39.9 without comorbidity)     Past Surgical History:  Procedure Laterality Date  . CESAREAN SECTION N/A 12/26/2016   Procedure: CESAREAN SECTION;  Surgeon: Vena Austria, MD;  Location: [facility] ORS;  Service: Obstetrics;  Laterality: N/A;  . DILATION AND CURETTAGE OF UTERUS    . DILATION AND EVACUATION N/A 02/26/2015   Procedure: DILATATION AND EVACUATION;  Surgeon: Nadara Mustard, MD;  Location: [facility] ORS;  Service: Gynecology;  Laterality: N/A;  . DILATION AND EVACUATION N/A 05/26/2015   Procedure: DILATATION AND EVACUATION;  Surgeon: [place] Bing, MD;  Location: [facility] ORS;  Service: Gynecology;  Laterality: N/A;    Family History  Problem Relation Age of Onset  . Cancer Maternal Aunt 51       ovar vs pancreatic--unknown primary  . Pancreatic cancer Maternal Grandmother 72  . Lung cancer Other 78  . Breast cancer Neg Hx   . Ovarian cancer Neg Hx     Social History   Socioeconomic History  . Marital status: Single    Spouse name: Not on file  . Number of children: 1  . Years of education: Not on file  . Highest education level: Not on file  Occupational History  . Not on file    Social Needs  . Financial resource strain: Not on file  . Food insecurity:    Worry: Not on file    Inability: Not on file  . Transportation needs:    Medical: Not on file    Non-medical: Not on file  Tobacco Use  . Smoking status: Never Smoker  . Smokeless tobacco: Never Used  Substance and Sexual Activity  . Alcohol use: No  . Drug use: No  . Sexual activity: Yes    Birth control/protection: I.U.D.    Comment: Mirena  Lifestyle  . Physical activity:    Days per week: Not on file    Minutes per session: Not on file  . Stress: Not on file  Relationships  . Social connections:    Talks on phone: Not on file    Gets together: Not on file    Attends religious service: Not on file    Active member of club or organization: Not on file    Attends meetings of clubs or organizations: Not on file    Relationship status: Not on file  . Intimate partner violence:    Fear of current or ex partner: Not on file    Emotionally abused: Not on file    Physically abused: Not on file    Forced sexual activity: Not on file  Other Topics Concern  . Not on file  Social History Narrative  . Not on file    Outpatient Medications Prior to Visit  Medication Sig Dispense Refill  . levonorgestrel (MIRENA) 20 MCG/24HR IUD 1 each by Intrauterine route once.     No facility-administered medications prior to visit.       ROS:  Review of Systems  Constitutional: Negative for fatigue, fever and unexpected weight change.  Respiratory: Negative for cough, shortness of breath and wheezing.   Cardiovascular: Negative for chest pain, palpitations and leg swelling.  Gastrointestinal: Negative for blood in stool, constipation, diarrhea, nausea and vomiting.  Endocrine: Negative for cold intolerance, heat intolerance and polyuria.  Genitourinary: Positive for pelvic pain. Negative for dyspareunia, dysuria, flank pain, frequency, genital sores, hematuria, menstrual problem, urgency, vaginal  bleeding, vaginal discharge and vaginal pain.  Musculoskeletal: Negative for back pain, joint swelling and myalgias.  Skin: Negative for rash.  Neurological: Negative for dizziness, syncope, light-headedness, numbness and headaches.  Hematological: Negative for adenopathy.  Psychiatric/Behavioral: Negative for agitation, confusion, sleep disturbance and suicidal ideas. The patient is not nervous/anxious.    BREAST: No symptoms   OBJECTIVE:   Vitals:  BP 90/64   Pulse 86   Ht 5\' 5"  (1.651 m)   Wt 204 lb (92.5 kg)   Breastfeeding No   BMI 33.95 kg/m   Physical Exam Vitals signs reviewed.  Constitutional:      Appearance: She is well-developed.  Pulmonary:     Effort: Pulmonary effort is normal.  Abdominal:     Palpations: Abdomen is soft.     Tenderness: There is generalized abdominal tenderness and tenderness in the right lower quadrant.  Genitourinary:    Pubic Area: No rash.      Labia:        Right: No rash, tenderness or lesion.        Left: No rash, tenderness or lesion.      Vagina: Normal. No vaginal discharge, erythema or tenderness.     Cervix: Normal.     Uterus: Normal. Tender. Not enlarged.      Adnexa:        Right: Tenderness present. No mass.         Left: Tenderness present. No mass.       Comments: IUD STRINGS IN CX OS Musculoskeletal: Normal range of motion.  Neurological:     Mental Status: She is alert and oriented to person, place, and time.  Psychiatric:        Behavior: Behavior normal.        Thought Content: Thought content normal.     Results:  ULTRASOUND REPORT  Location: [facility]  Date of Service: 11/13/2018    Indications:Pelvic Pain, IUD check  Findings:  The uterus is retroverted and measures 8.6 x 5.3 x 4.1cm. Echo texture is homogenous without evidence of focal masses.  The Endometrium measures 6.6 mm with trace amount of fluid seen. IUD in place.  Right Ovary measures 3.1 x 2.2 x 2.1 cm. It is normal in  appearance. Left Ovary measures 2.4 x 1.7 x 1.4 cm. It is normal in appearance. Survey of the adnexa demonstrates no adnexal masses. There is no free fluid in the cul de sac.  Impression: 1. Normal gyn ultrasound.   Recommendations: 1.Clinical correlation with the patient's History and Physical Exam.   Darlina GuysAbby M Clarke, RDMS RVT   Assessment/Plan: Pelvic pain - Tender on exam. Neg GYN u/s. IUD in correct location. Most likely  constipation given sx/exam. Add fiber, lots of water, colace. F/u prn.  - Plan: US PELVIS TRANSVANGINAL NON-OB (TV ONLY)  Encounter for routine checking of intrauterine contraceptive device (IUD) - IUD in place.      Return if symptoms worsen or fail to improve.  Luvern Mcisaac B. Yemariam Magar, PA-C 11/13/2018 1:39 PM

## 2020-02-14 ENCOUNTER — Other Ambulatory Visit (HOSPITAL_COMMUNITY)
Admission: RE | Admit: 2020-02-14 | Discharge: 2020-02-14 | Disposition: A | Discharge status: Home / Self Care | Payer: Medicaid Other | Source: Ambulatory Visit | Attending: Obstetrics & Gynecology | Admitting: Obstetrics & Gynecology

## 2020-02-14 ENCOUNTER — Encounter: Payer: Self-pay | Admitting: Obstetrics & Gynecology

## 2020-02-14 ENCOUNTER — Ambulatory Visit (INDEPENDENT_AMBULATORY_CARE_PROVIDER_SITE_OTHER): Payer: Medicaid Other | Admitting: Obstetrics & Gynecology

## 2020-02-14 ENCOUNTER — Other Ambulatory Visit: Payer: Self-pay

## 2020-02-14 VITALS — BP 98/60 | Ht 65.0 in | Wt 214.0 lb

## 2020-02-14 DIAGNOSIS — Z01419 Encounter for gynecological examination (general) (routine) without abnormal findings: Secondary | ICD-10-CM | POA: Diagnosis not present

## 2020-02-14 DIAGNOSIS — Z124 Encounter for screening for malignant neoplasm of cervix: Secondary | ICD-10-CM

## 2020-02-14 DIAGNOSIS — Z0000 Encounter for general adult medical examination without abnormal findings: Secondary | ICD-10-CM

## 2020-02-14 NOTE — Progress Notes (Signed)
HPI:      [name] is a 29 y.o. [phone] who LMP was No LMP recorded. (Menstrual status: IUD)., she presents today for her annual examination. The patient has no complaints today. The patient is sexually active. Her last pap: approximate date 2019 and was normal. The patient does perform self breast exams.  There is no notable family history of breast or ovarian cancer in her family.  The patient has regular exercise: yes.  The patient denies current symptoms of depression.    GYN History: Contraception: IUD  PMHx: Past Medical History:  Diagnosis Date  . Family history of pancreatic cancer   . Migraine    with aura  . Missed abortions 2010  . Obesity (BMI 35.0-39.9 without comorbidity)    Past Surgical History:  Procedure Laterality Date  . CESAREAN SECTION N/A 12/26/2016   Procedure: CESAREAN SECTION;  Surgeon: Amber Austria, MD;  Location: [facility] ORS;  Service: Obstetrics;  Laterality: N/A;  . DILATION AND CURETTAGE OF UTERUS    . DILATION AND EVACUATION N/A 02/26/2015   Procedure: DILATATION AND EVACUATION;  Surgeon: Amber Mustard, MD;  Location: [facility] ORS;  Service: Gynecology;  Laterality: N/A;  . DILATION AND EVACUATION N/A 05/26/2015   Procedure: DILATATION AND EVACUATION;  Surgeon: Amber Bing, MD;  Location: [facility] ORS;  Service: Gynecology;  Laterality: N/A;   Family History  Problem Relation Age of Onset  . Cancer Maternal Aunt 51       ovar vs pancreatic--unknown primary  . Pancreatic cancer Maternal Grandmother 72  . Lung cancer Other 78  . Breast cancer Neg Hx   . Ovarian cancer Neg Hx    Social History   Tobacco Use  . Smoking status: Never Smoker  . Smokeless tobacco: Never Used  Substance Use Topics  . Alcohol use: No  . Drug use: No    Current Outpatient Medications:  .  levonorgestrel (MIRENA) 20 MCG/24HR IUD, 1 each by Intrauterine route once., Disp: , Rfl:  Allergies: Patient has no known allergies.  Review of Systems  Constitutional:  Negative for chills, fever and malaise/fatigue.  HENT: Negative for congestion, sinus pain and sore throat.   Eyes: Negative for blurred vision and pain.  Respiratory: Negative for cough and wheezing.   Cardiovascular: Negative for chest pain and leg swelling.  Gastrointestinal: Negative for abdominal pain, constipation, diarrhea, heartburn, nausea and vomiting.  Genitourinary: Negative for dysuria, frequency, hematuria and urgency.  Musculoskeletal: Negative for back pain, joint pain, myalgias and neck pain.  Skin: Negative for itching and rash.  Neurological: Negative for dizziness, tremors and weakness.  Endo/Heme/Allergies: Does not bruise/bleed easily.  Psychiatric/Behavioral: Negative for depression. The patient is not nervous/anxious and does not have insomnia.     Objective: BP 98/60   Ht 5\' 5"  (1.651 m)   Wt 214 lb (97.1 kg)   BMI 35.61 kg/m   Filed Weights   02/14/20 0953  Weight: 214 lb (97.1 kg)   Body mass index is 35.61 kg/m. Physical Exam Constitutional:      General: She is not in acute distress.    Appearance: She is well-developed.  Genitourinary:     Pelvic exam was performed with patient supine.     Vagina, uterus and rectum normal.     No lesions in the vagina.     No vaginal bleeding.     No cervical motion tenderness, friability, lesion or polyp.     IUD strings visualized.     Uterus  is mobile.     Uterus is not enlarged.     No uterine mass detected.    Uterus is midaxial.     No right or left adnexal mass present.     Right adnexa not tender.     Left adnexa not tender.  HENT:     Head: Normocephalic and atraumatic. No laceration.     Right Ear: Hearing normal.     Left Ear: Hearing normal.     Mouth/Throat:     Pharynx: Uvula midline.  Eyes:     Pupils: Pupils are equal, round, and reactive to light.  Neck:     Thyroid: No thyromegaly.  Cardiovascular:     Rate and Rhythm: Normal rate and regular rhythm.     Heart sounds: No murmur. No  friction rub. No gallop.   Pulmonary:     Effort: Pulmonary effort is normal. No respiratory distress.     Breath sounds: Normal breath sounds. No wheezing.  Chest:     Breasts:        Right: No mass, skin change or tenderness.        Left: No mass, skin change or tenderness.  Abdominal:     General: Bowel sounds are normal. There is no distension.     Palpations: Abdomen is soft.     Tenderness: There is no abdominal tenderness. There is no rebound.  Musculoskeletal:        General: Normal range of motion.     Cervical back: Normal range of motion and neck supple.  Neurological:     Mental Status: She is alert and oriented to person, place, and time.     Cranial Nerves: No cranial nerve deficit.  Skin:    General: Skin is warm and dry.  Psychiatric:        Judgment: Judgment normal.  Vitals reviewed.     Assessment:  ANNUAL EXAM 1. Women's annual routine gynecological examination   2. Screening for cervical cancer      Screening Plan:            1.  Cervical Screening-  Pap smear done today  2. Breast screening- Exam annually and mammogram>40 planned   3. Plans pregnancy in the next year or so.  Counseled as to IUD removal when ready.  Preconceptual counseling done.  4. Labs managed by PCP  5. Counseling for contraception: IUD       F/U  Return in about 1 year (around 02/13/2021) for Annual.  Amber Applebaum, MD, [facility], [facility] 02/14/2020  10:26 AM

## 2020-02-14 NOTE — Patient Instructions (Signed)
Preparing for Pregnancy °If you are considering becoming pregnant, make an appointment to see your regular health care provider to learn how to prepare for a safe and healthy pregnancy (preconception care). During a preconception care visit, your health care provider will: °· Do a complete physical exam, including a Pap test. °· Take a complete medical history. °· Give you information, answer your questions, and help you resolve problems. °Preconception checklist °Medical history °· Tell your health care provider about any current or past medical conditions. Your pregnancy or your ability to become pregnant may be affected by chronic conditions, such as diabetes, chronic hypertension, and thyroid problems. °· Include your family's medical history as well as your partner's medical history. °· Tell your health care provider about any history of STIs (sexually transmitted infections). These can affect your pregnancy. In some cases, they can be passed to your baby. Discuss any concerns that you have about STIs. °· If indicated, discuss the benefits of genetic testing. This testing will show whether there are any genetic conditions that may be passed from you or your partner to your baby. °· Tell your health care provider about: °? Any problems you have had with conception or pregnancy. °? Any medicines you take. These include vitamins, herbal supplements, and over-the-counter medicines. °? Your history of immunizations. Discuss any vaccinations that you may need. °Diet °· Ask your health care provider what to include in a healthy diet that has a balance of nutrients. This is especially important when you are pregnant or preparing to become pregnant. °· Ask your health care provider to help you reach a healthy weight before pregnancy. °? If you are overweight, you may be at higher risk for certain complications, such as high blood pressure, diabetes, and preterm birth. °? If you are underweight, you are more likely to  have a baby who has a low birth weight. °Lifestyle, work, and home °· Let your health care provider know: °? About any lifestyle habits that you have, such as alcohol use, drug use, or smoking. °? About recreational activities that may put you at risk during pregnancy, such as downhill skiing and certain exercise programs. °? Tell your health care provider about any international travel, especially any travel to places with an active Zika virus outbreak. °? About harmful substances that you may be exposed to at work or at home. These include chemicals, pesticides, radiation, or even litter boxes. °? If you do not feel safe at home. °Mental health °· Tell your health care provider about: °? Any history of mental health conditions, including feelings of depression, sadness, or anxiety. °? Any medicines that you take for a mental health condition. These include herbs and supplements. °Home instructions to prepare for pregnancy °Lifestyle ° °· Eat a balanced diet. This includes fresh fruits and vegetables, whole grains, lean meats, low-fat dairy products, healthy fats, and foods that are high in fiber. Ask to meet with a nutritionist or registered dietitian for assistance with meal planning and goals. °· Get regular exercise. Try to be active for at least 30 minutes a day on most days of the week. Ask your health care provider which activities are safe during pregnancy. °· Do not use any products that contain nicotine or tobacco, such as cigarettes and e-cigarettes. If you need help quitting, ask your health care provider. °· Do not drink alcohol. °· Do not take illegal drugs. °· Maintain a healthy weight. Ask your health care provider what weight range is right for you. °General   instructions °· Keep an accurate record of your menstrual periods. This makes it easier for your health care provider to determine your baby's due date. °· Begin taking prenatal vitamins and folic acid supplements daily as directed by your  health care provider. °· Manage any chronic conditions, such as high blood pressure and diabetes, as told by your health care provider. This is important. °How do I know that I am pregnant? °You may be pregnant if you have been sexually active and you miss your period. Symptoms of early pregnancy include: °· Mild cramping. °· Very light vaginal bleeding (spotting). °· Feeling unusually tired. °· Nausea and vomiting (morning sickness). °If you have any of these symptoms and you suspect that you might be pregnant, you can take a home pregnancy test. These tests check for a hormone in your urine (human chorionic gonadotropin, or hCG). A woman's body begins to make this hormone during early pregnancy. These tests are very accurate. Wait until at least the first day after you miss your period to take one. If the test shows that you are pregnant (you get a positive result), call your health care provider to make an appointment for prenatal care. °What should I do if I become pregnant? ° °  ° °· Make an appointment with your health care provider as soon as you suspect you are pregnant. °· Do not use any products that contain nicotine, such as cigarettes, chewing tobacco, and e-cigarettes. If you need help quitting, ask your health care provider. °· Do not drink alcoholic beverages. Alcohol is related to a number of birth defects. °· Avoid toxic odors and chemicals. °· You may continue to have sexual intercourse if it does not cause pain or other problems, such as vaginal bleeding. °This information is not intended to replace advice given to you by your health care provider. Make sure you discuss any questions you have with your health care provider. °Document Revised: 08/31/2017 Document Reviewed: 03/20/2016 °Elsevier Patient Education © 2020 [employer] Inc. ° °

## 2020-02-17 LAB — CYTOLOGY - PAP
Comment: NEGATIVE
Diagnosis: NEGATIVE
High risk HPV: NEGATIVE

## 2020-07-27 ENCOUNTER — Ambulatory Visit: Payer: Medicaid Other | Attending: Internal Medicine

## 2020-07-27 DIAGNOSIS — Z23 Encounter for immunization: Secondary | ICD-10-CM

## 2020-07-27 NOTE — Progress Notes (Signed)
   Covid-19 Vaccination Clinic  Name:  Amber Duffy    MRN: [id number] DOB: Mar 13, 1991  07/27/2020  [name] was observed post Covid-19 immunization for 15 minutes without incident. She was provided with Vaccine Information Sheet and instruction to access the V-Safe system.   [name] was instructed to call 911 with any severe reactions post vaccine: Marland Kitchen Difficulty breathing  . Swelling of face and throat  . A fast heartbeat  . A bad rash all over body  . Dizziness and weakness   Immunizations Administered    No immunizations on file.

## 2020-08-24 ENCOUNTER — Ambulatory Visit: Payer: Medicaid Other

## 2020-08-27 ENCOUNTER — Ambulatory Visit: Payer: Medicaid Other | Attending: Internal Medicine

## 2020-08-27 DIAGNOSIS — Z23 Encounter for immunization: Secondary | ICD-10-CM

## 2020-08-27 NOTE — Progress Notes (Signed)
   Covid-19 Vaccination Clinic  Name:  Nizhoni Parlow    MRN: [id number] DOB: 07-06-1991  08/27/2020  Ms. Luthi was observed post Covid-19 immunization for 15 minutes without incident. She was provided with Vaccine Information Sheet and instruction to access the V-Safe system.   Ms. Maclin was instructed to call 911 with any severe reactions post vaccine: Marland Kitchen Difficulty breathing  . Swelling of face and throat  . A fast heartbeat  . A bad rash all over body  . Dizziness and weakness   Immunizations Administered    Name Date Dose VIS Date Route   Moderna COVID-19 Vaccine 08/27/2020  1:19 PM 0.5 mL 07/01/2020 Intramuscular   Manufacturer: Moderna   Lot: [id number]   NDC: 63149-702-63

## 2021-03-29 ENCOUNTER — Ambulatory Visit: Payer: Medicaid Other | Admitting: Obstetrics and Gynecology

## 2021-06-01 ENCOUNTER — Telehealth: Payer: Self-pay | Admitting: Obstetrics

## 2021-06-01 NOTE — Telephone Encounter (Signed)
This pt is scheduled to come in 10/20 for Mirena removal and reinsertion with [facility].

## 2021-06-02 ENCOUNTER — Other Ambulatory Visit: Payer: Self-pay

## 2021-06-02 ENCOUNTER — Encounter: Payer: Self-pay | Admitting: Obstetrics

## 2021-06-02 ENCOUNTER — Ambulatory Visit: Payer: Medicaid Other | Admitting: Obstetrics

## 2021-06-02 VITALS — BP 110/60 | Wt 203.0 lb

## 2021-06-02 DIAGNOSIS — R109 Unspecified abdominal pain: Secondary | ICD-10-CM | POA: Diagnosis not present

## 2021-06-02 DIAGNOSIS — R102 Pelvic and perineal pain: Secondary | ICD-10-CM

## 2021-06-02 LAB — POCT URINALYSIS DIPSTICK
Bilirubin, UA: NEGATIVE
Blood, UA: NEGATIVE
Glucose, UA: NEGATIVE
Ketones, UA: NEGATIVE
Leukocytes, UA: NEGATIVE
Nitrite, UA: NEGATIVE
Protein, UA: NEGATIVE
Spec Grav, UA: 1.01 (ref 1.010–1.025)
Urobilinogen, UA: 0.2 E.U./dL
pH, UA: 6 (ref 5.0–8.0)

## 2021-06-02 NOTE — Progress Notes (Signed)
Obstetrics & Gynecology Office Visit   Chief Complaint:  Chief Complaint  Patient presents with   Pelvic Pain    Bilat lower pain x 1 wk ago, no other symptoms, thinks it's the IUD and wants it removed. RM 4    History of Present Illness: Amber Duffy presents for some lower bilateral pain which she feels is adnexal. This started two weeks ago. She describes it as sharp and intermittent- it has responded somewhat to Tylenol. She denies any abnormal bleeding. She contracepts with an IUD, and wonders whether the IUD is causing the pain. She has also not felt the IUD strings.  Her periods are absent as long as the IUD has been in place. She shares some remiss that she  is not having a period, and has considered having the IUD removed today. She has not considered other causes of the pain, and does admit to problems with constipation. At her last annual exam in 2021, she was considering the IUD removal, and seeking another pregnancy. She is uncertain about this today.She is not on any daily vitamin or other medication. Her OB hx is significant for a Cesarean section in 2018.   Review of Systems:  Review of Systems  Constitutional: Negative.   HENT: Negative.    Eyes: Negative.   Respiratory: Negative.    Cardiovascular: Negative.   Gastrointestinal:  Positive for constipation.       Occasional constipation  Genitourinary: Negative.   Musculoskeletal: Negative.   Skin: Negative.   Neurological: Negative.   Endo/Heme/Allergies: Negative.   Psychiatric/Behavioral: Negative.      Past Medical History:  Past Medical History:  Diagnosis Date   Family history of pancreatic cancer    Migraine    with aura   Missed abortions 2010   Obesity (BMI 35.0-39.9 without comorbidity)     Past Surgical History:  Past Surgical History:  Procedure Laterality Date   CESAREAN SECTION N/A 12/26/2016   Procedure: CESAREAN SECTION;  Surgeon: Vena Austria, MD;  Location: [facility];  Service: Obstetrics;   Laterality: N/A;   DILATION AND CURETTAGE OF UTERUS     DILATION AND EVACUATION N/A 02/26/2015   Procedure: DILATATION AND EVACUATION;  Surgeon: Nadara Mustard, MD;  Location: [facility];  Service: Gynecology;  Laterality: N/A;   DILATION AND EVACUATION N/A 05/26/2015   Procedure: DILATATION AND EVACUATION;  Surgeon: [place] Bing, MD;  Location: [facility];  Service: Gynecology;  Laterality: N/A;    Gynecologic History: No LMP recorded. (Menstrual status: IUD).  Obstetric History: [id number]  Family History:  Family History  Problem Relation Age of Onset   Cancer Maternal Aunt 30       ovar vs pancreatic--unknown primary   Pancreatic cancer Maternal Grandmother 35   Lung cancer Other 78   Breast cancer Neg Hx    Ovarian cancer Neg Hx     Social History:  Social History   Socioeconomic History   Marital status: Single    Spouse name: Not on file   Number of children: 1   Years of education: Not on file   Highest education level: Not on file  Occupational History   Not on file  Tobacco Use   Smoking status: Never   Smokeless tobacco: Never  Vaping Use   Vaping Use: Never used  Substance and Sexual Activity   Alcohol use: No   Drug use: No   Sexual activity: Yes    Birth control/protection: I.U.D.    Comment: Mirena  Other Topics Concern   Not on file  Social History Narrative   Not on file   Social Determinants of Health   Financial Resource Strain: Not on file  Food Insecurity: Not on file  Transportation Needs: Not on file  Physical Activity: Not on file  Stress: Not on file  Social Connections: Not on file  Intimate Partner Violence: Not on file    Allergies:  No Known Allergies  Medications: Prior to Admission medications   Medication Sig Start Date End Date Taking? Authorizing Provider  levonorgestrel (MIRENA) 20 MCG/24HR IUD 1 each by Intrauterine route once.    [provider]    Physical Exam Vitals:  Vitals:   06/02/21 1019  BP:  110/60   No LMP recorded. (Menstrual status: IUD).  Physical Exam Constitutional:      Appearance: Normal appearance. She is obese.  HENT:     Head: Normocephalic and atraumatic.  Cardiovascular:     Rate and Rhythm: Normal rate and regular rhythm.  Pulmonary:     Effort: Pulmonary effort is normal.     Breath sounds: Normal breath sounds.  Abdominal:     Palpations: Abdomen is soft.  Genitourinary:    General: Normal vulva.     Comments: No rashes or irritation. Normal external features. No vaginal discharge noted. Pelvic exam reveals anteverted uterus, non enlarged, with palpable IUD strings. Bilateral adnexal discomfort noted with bimanual palpation. Musculoskeletal:        General: Normal range of motion.     Cervical back: Normal range of motion and neck supple.  Skin:    General: Skin is warm and dry.  Neurological:     General: No focal deficit present.     Mental Status: She is alert and oriented to person, place, and time.  Psychiatric:        Mood and Affect: Mood normal.        Behavior: Behavior normal.     Assessment: 30 y.o. [id number]  with bilateral adnexal pain x two weeks.  Plan: Problem List Items Addressed This Visit   None  Discussed the options for follow up- Will order pelvic ultrasound  to confirm IUD placement, and evaluate adnexal area. She is also going to address her constipation, which during our visit, she admits may be a contributor. Will review the ultrasound report and followu p based on the findings.  Mirna Mires, CNM  06/02/2021 5:43 PM

## 2021-06-02 NOTE — Telephone Encounter (Signed)
Noted. Will order to arrive by apt date/time. 

## 2021-06-14 ENCOUNTER — Other Ambulatory Visit: Payer: Self-pay

## 2021-06-14 ENCOUNTER — Ambulatory Visit
Admission: RE | Admit: 2021-06-14 | Discharge: 2021-06-14 | Disposition: A | Discharge status: Home / Self Care | Payer: Medicaid Other | Source: Ambulatory Visit | Attending: Obstetrics | Admitting: Obstetrics

## 2021-06-14 DIAGNOSIS — R102 Pelvic and perineal pain: Secondary | ICD-10-CM | POA: Diagnosis not present

## 2021-06-14 NOTE — Progress Notes (Signed)
Called and left a VM for this patient , reassuring her that her IUD is correctly placed. She will follow up with her PCP to address any ongoing abdominal pain, or call us with any questions.  Mirna Mires, CNM  06/14/2021 5:45 PM

## 2021-06-28 ENCOUNTER — Ambulatory Visit: Payer: Medicaid Other | Admitting: Obstetrics and Gynecology

## 2021-07-01 ENCOUNTER — Ambulatory Visit: Payer: Medicaid Other | Admitting: Obstetrics

## 2021-07-07 NOTE — Telephone Encounter (Signed)
07/01/21 appointment cancelled

## 2021-09-29 ENCOUNTER — Other Ambulatory Visit (HOSPITAL_COMMUNITY)
Admission: RE | Admit: 2021-09-29 | Discharge: 2021-09-29 | Disposition: A | Discharge status: Home / Self Care | Payer: Medicaid Other | Source: Ambulatory Visit | Attending: Obstetrics | Admitting: Obstetrics

## 2021-09-29 ENCOUNTER — Encounter: Payer: Self-pay | Admitting: Obstetrics

## 2021-09-29 ENCOUNTER — Ambulatory Visit (INDEPENDENT_AMBULATORY_CARE_PROVIDER_SITE_OTHER): Payer: Medicaid Other | Admitting: Obstetrics

## 2021-09-29 ENCOUNTER — Other Ambulatory Visit: Payer: Self-pay

## 2021-09-29 VITALS — BP 126/84 | Ht 62.0 in | Wt 204.0 lb

## 2021-09-29 DIAGNOSIS — N898 Other specified noninflammatory disorders of vagina: Secondary | ICD-10-CM | POA: Insufficient documentation

## 2021-09-29 DIAGNOSIS — Z124 Encounter for screening for malignant neoplasm of cervix: Secondary | ICD-10-CM | POA: Diagnosis not present

## 2021-09-29 DIAGNOSIS — Z975 Presence of (intrauterine) contraceptive device: Secondary | ICD-10-CM | POA: Diagnosis not present

## 2021-09-29 NOTE — Progress Notes (Signed)
Gynecology Annual Exam   PCP: [facility], [place]  Chief Complaint:  Chief Complaint  Patient presents with   Annual Exam    History of Present Illness: Patient is a 31 y.o. [id number] presents for annual exam. The patient has no complaints today.   LMP: No LMP recorded. (Menstrual status: IUD).She does not get periods with her IUD. She recenlty had an ultrasound due to some bilateral pelvic pain. Her IUD is in place at the fundus. Hx of primary CS with her second pregnancy- LGA baby.  The patient is sexually active. She currently uses IUD for contraception. She denies dyspareunia.  The patient does not perform self breast exams.  There  is a hx   notable family history of breast or ovarian cancer in her family. A cousin had ovarian CA in her 4s.  The patient wears seatbelts: yes.   The patient has regular exercise:  she woks around the fields where her children play. She is aware that she needs to increase her activity. .    The patient denies current symptoms of depression.    Review of Systems: Review of Systems  Constitutional: Negative.   HENT: Negative.    Eyes: Negative.   Respiratory: Negative.    Cardiovascular: Negative.   Gastrointestinal: Negative.   Genitourinary: Negative.   Musculoskeletal: Negative.   Skin: Negative.   Neurological: Negative.   Endo/Heme/Allergies: Negative.   Psychiatric/Behavioral: Negative.     Past Medical History:  Patient Active Problem List   Diagnosis Date Noted   Postoperative anemia due to acute blood loss 12/28/2016    Anemia due to pregnancy present on admission. She had blood loss with surgery resulting in worsening anemia.     Maternal varicella, non-immune 12/28/2016   BMI 33.0-33.9,adult 12/14/2016    Past Surgical History:  Past Surgical History:  Procedure Laterality Date   CESAREAN SECTION N/A 12/26/2016   Procedure: CESAREAN SECTION;  Surgeon: Vena Austria, MD;  Location: [facility];  Service:  Obstetrics;  Laterality: N/A;   DILATION AND CURETTAGE OF UTERUS     DILATION AND EVACUATION N/A 02/26/2015   Procedure: DILATATION AND EVACUATION;  Surgeon: Nadara Mustard, MD;  Location: [facility];  Service: Gynecology;  Laterality: N/A;   DILATION AND EVACUATION N/A 05/26/2015   Procedure: DILATATION AND EVACUATION;  Surgeon: Enon Bing, MD;  Location: [facility];  Service: Gynecology;  Laterality: N/A;    Gynecologic History:  No LMP recorded. (Menstrual status: IUD). Contraception: IUD Last Pap: 2021Results were: no abnormalities   Obstetric History: [id number]  Family History:  Family History  Problem Relation Age of Onset   Cancer Maternal Aunt 67       ovar vs pancreatic--unknown primary   Ovarian cancer Maternal Aunt    Pancreatic cancer Maternal Grandmother 69   Lung cancer Other 21   Breast cancer Neg Hx     Social History:  Social History   Socioeconomic History   Marital status: Single    Spouse name: Not on file   Number of children: 1   Years of education: Not on file   Highest education level: Not on file  Occupational History   Not on file  Tobacco Use   Smoking status: Never   Smokeless tobacco: Never  Vaping Use   Vaping Use: Never used  Substance and Sexual Activity   Alcohol use: No   Drug use: No   Sexual activity: Yes    Birth control/protection: I.U.D.  Comment: Mirena  Other Topics Concern   Not on file  Social History Narrative   Not on file   Social Determinants of Health   Financial Resource Strain: Not on file  Food Insecurity: Not on file  Transportation Needs: Not on file  Physical Activity: Not on file  Stress: Not on file  Social Connections: Not on file  Intimate Partner Violence: Not on file    Allergies:  No Known Allergies  Medications: Prior to Admission medications   Medication Sig Start Date End Date Taking? Authorizing Provider  levonorgestrel (MIRENA) 20 MCG/24HR IUD 1 each by Intrauterine route once.    Yes [provider]    Physical Exam Vitals: Blood pressure 126/84, height 5\' 2"  (1.575 m), weight 204 lb (92.5 kg).  General: NAD HEENT: normocephalic, anicteric Thyroid: no enlargement, no palpable nodules Pulmonary: No increased work of breathing, CTAB Cardiovascular: RRR, distal pulses 2+ Breast: Breast symmetrical, no tenderness, no palpable nodules or masses, no skin or nipple retraction present, no nipple discharge.  No axillary or supraclavicular lymphadenopathy. Abdomen: NABS, soft, non-tender, non-distended.  Umbilicus without lesions.  No hepatomegaly, splenomegaly or masses palpable. No evidence of hernia  Genitourinary:  External: Normal external female genitalia.  Normal urethral meatus, normal Bartholin's and Skene's glands.    Vagina: Normal vaginal mucosa, no evidence of prolapse.    Cervix: Grossly normal in appearance, no bleeding Moderate amount of clumpy white discharge noted.  Uterus: Non-enlarged, mobile, normal contour.  No CMT  Adnexa: ovaries non-enlarged, no adnexal masses  Rectal: deferred  Lymphatic: no evidence of inguinal lymphadenopathy Extremities: no edema, erythema, or tenderness Neurologic: Grossly intact Psychiatric: mood appropriate, affect full  Female chaperone present for pelvic and breast  portions of the physical exam    Assessment: 31 y.o. 38 routine annual exam Vaginal discharge  Plan: Problem List Items Addressed This Visit   None   2) STI screening  wasoffered and declined. An aptima swab was retrieved to check for yeast vaginitis.  2)  ASCCP guidelines and rational discussed.  Patient opts for every 3 years screening interval  3) Contraception - the patient is currently using  IUD.  She is attempting to conceive in the near future so will make another appointment to have it removed. We discussed preconceptual health goals, including weight loss, increased exercise, and starting on a prenatal vitamin  4) Routine  healthcare maintenance including cholesterol, diabetes screening discussed managed by PCP  5) No follow-ups on file.   [id number], CNM  09/29/2021 9:35 AM   [facility], [facility] 09/29/2021, 9:35 AM

## 2021-09-30 LAB — CERVICOVAGINAL ANCILLARY ONLY
Bacterial Vaginitis (gardnerella): NEGATIVE
Candida Glabrata: NEGATIVE
Candida Vaginitis: NEGATIVE
Comment: NEGATIVE
Comment: NEGATIVE
Comment: NEGATIVE

## 2021-10-04 ENCOUNTER — Encounter: Payer: Self-pay | Admitting: Obstetrics

## 2021-10-04 LAB — CYTOLOGY - PAP
Diagnosis: NEGATIVE
Diagnosis: REACTIVE

## 2021-10-20 ENCOUNTER — Encounter: Payer: Self-pay | Admitting: Obstetrics and Gynecology

## 2022-02-01 ENCOUNTER — Ambulatory Visit: Payer: Medicaid Other | Admitting: Obstetrics

## 2022-02-01 ENCOUNTER — Other Ambulatory Visit (HOSPITAL_COMMUNITY)
Admission: RE | Admit: 2022-02-01 | Discharge: 2022-02-01 | Disposition: A | Discharge status: Home / Self Care | Payer: Medicaid Other | Source: Ambulatory Visit | Attending: Obstetrics | Admitting: Obstetrics

## 2022-02-01 ENCOUNTER — Encounter: Payer: Self-pay | Admitting: Obstetrics

## 2022-02-01 VITALS — BP 122/74 | Ht 66.0 in | Wt 195.0 lb

## 2022-02-01 DIAGNOSIS — Z113 Encounter for screening for infections with a predominantly sexual mode of transmission: Secondary | ICD-10-CM | POA: Insufficient documentation

## 2022-02-01 DIAGNOSIS — N898 Other specified noninflammatory disorders of vagina: Secondary | ICD-10-CM

## 2022-02-01 LAB — POCT WET PREP (WET MOUNT): Trichomonas Wet Prep HPF POC: ABSENT

## 2022-02-01 NOTE — Progress Notes (Signed)
Obstetrics & Gynecology Office Visit   Chief Complaint:  Chief Complaint  Patient presents with   Vaginitis    Pt having some vaginal pain after IC   Dysuria    History of Present Illness: Amber Duffy presents initially with a concern for UTI. She has had some pain when urinating. On further discussion. She shares that she became sore after having IC with an ex. She took a picture of the area of irritation that appears like a small laceration on the anterior part of her vagina. She describes having less natural vaginal lubrication recently. She is also requesting some STI screening.   Review of Systems:  Review of Systems  Constitutional: Negative.   HENT: Negative.    Eyes: Negative.   Respiratory: Negative.    Cardiovascular: Negative.   Gastrointestinal: Negative.   Genitourinary: Negative.   Musculoskeletal: Negative.   Skin: Negative.   Neurological: Negative.   Endo/Heme/Allergies: Negative.   Psychiatric/Behavioral: Negative.      Past Medical History:  Past Medical History:  Diagnosis Date   Family history of pancreatic cancer    2/23 cancer genetic testing letter sent   Migraine    with aura   Missed abortions 2010   Obesity (BMI 35.0-39.9 without comorbidity)    Postpartum care following cesarean delivery 12/29/2016   Status post cesarean section 12/28/2016    Past Surgical History:  Past Surgical History:  Procedure Laterality Date   CESAREAN SECTION N/A 12/26/2016   Procedure: CESAREAN SECTION;  Surgeon: Malachy Mood, MD;  Location: [facility];  Service: Obstetrics;  Laterality: N/A;   DILATION AND CURETTAGE OF UTERUS     DILATION AND EVACUATION N/A 02/26/2015   Procedure: DILATATION AND EVACUATION;  Surgeon: Gae Dry, MD;  Location: [facility];  Service: Gynecology;  Laterality: N/A;   DILATION AND EVACUATION N/A 05/26/2015   Procedure: DILATATION AND EVACUATION;  Surgeon: Aletha Halim, MD;  Location: [facility];  Service: Gynecology;  Laterality:  N/A;    Gynecologic History: No LMP recorded. (Menstrual status: IUD).  Obstetric [id number]  Family History:  Family History  Problem Relation Age of Onset   Cancer Maternal Aunt 64       ovar vs pancreatic--unknown primary   Ovarian cancer Maternal Aunt    Pancreatic cancer Maternal Grandmother 72   Lung cancer Other 30   Breast cancer Neg Hx     Social History:  Social History   Socioeconomic History   Marital status: Single    Spouse name: Not on file   Number of children: 1   Years of education: Not on file   Highest education level: Not on file  Occupational History   Not on file  Tobacco Use   Smoking status: Never   Smokeless tobacco: Never  Vaping Use   Vaping Use: Never used  Substance and Sexual Activity   Alcohol use: No   Drug use: No   Sexual activity: Yes    Birth control/protection: I.U.D.    Comment: Mirena  Other Topics Concern   Not on file  Social History Narrative   Not on file   Social Determinants of Health   Financial Resource Strain: Not on file  Food Insecurity: Not on file  Transportation Needs: Not on file  Physical Activity: Not on file  Stress: Not on file  Social Connections: Not on file  Intimate Partner Violence: Not on file    Allergies:  No Known Allergies  Medications: Prior to Admission medications  Medication Sig Start Date End Date Taking? Authorizing Provider  levonorgestrel (MIRENA) 20 MCG/24HR IUD 1 each by Intrauterine route once.    [provider]    Physical Exam Vitals:  Vitals:   02/01/22 1620  BP: 122/74   No LMP recorded. (Menstrual status: IUD).  Physical Exam Constitutional:      Appearance: Normal appearance. She is obese.  Cardiovascular:     Rate and Rhythm: Normal rate and regular rhythm.  Pulmonary:     Effort: Pulmonary effort is normal.     Breath sounds: Normal breath sounds.  Abdominal:     Palpations: Abdomen is soft.     Comments: Adipose, soft   Genitourinary:    General: Normal vulva.     Comments: Ternal rashes or lesions. Spec exam_ moderate amount of white , clumpy, nonmalodorous discharge noted.  Musculoskeletal:     Cervical back: Normal range of motion and neck supple.  Skin:    General: Skin is warm and dry.  Neurological:     Mental Status: She is alert.    No problem-specific Assessment & Plan notes found for this encounter.  Assessment: 31 y.o. [id number] with vaginal discharge and irritation.  Plan: Problem List Items Addressed This Visit   None Visit Diagnoses     Vaginal discharge    -  Primary   Relevant Orders   POCT Wet Prep [facility])   Screen for STD (sexually transmitted disease)       Relevant Orders   Cervicovaginal ancillary only      Her wet prep indicates yeast. RX for Diflucan sent. She is also encouraged to use some Monistat cream in the evening.  Aptima swab is sent for STI screening. A total of 20 minutes is spent in direct patient care and chart review, as well as ordering tests and labs.  F/U PRN.  Imagene Riches, CNM  02/01/2022 5:02 PM

## 2022-02-02 ENCOUNTER — Telehealth: Payer: Self-pay

## 2022-02-02 MED ORDER — FLUCONAZOLE 150 MG PO TABS: 150.0000 mg | ORAL_TABLET | Freq: Once | ORAL | 0 refills | Status: AC

## 2022-02-02 NOTE — Telephone Encounter (Signed)
Pt saw MMF yesterday and was suppose to get a diflucan sent. MMFs note say sent, nothing in chart/pharmacy. Rx sent, pt aware.

## 2022-02-03 LAB — CERVICOVAGINAL ANCILLARY ONLY
Bacterial Vaginitis (gardnerella): NEGATIVE
Candida Glabrata: NEGATIVE
Candida Vaginitis: POSITIVE — AB
Chlamydia: NEGATIVE
Comment: NEGATIVE
Comment: NEGATIVE
Comment: NEGATIVE
Comment: NEGATIVE
Comment: NEGATIVE
Comment: NORMAL
Neisseria Gonorrhea: NEGATIVE
Trichomonas: NEGATIVE

## 2022-02-13 ENCOUNTER — Encounter: Payer: Self-pay | Admitting: Obstetrics

## 2022-06-04 ENCOUNTER — Emergency Department: Payer: Medicaid Other

## 2022-06-04 ENCOUNTER — Emergency Department
Admission: EM | Admit: 2022-06-04 | Discharge: 2022-06-05 | Disposition: A | Discharge status: Home / Self Care | Payer: Medicaid Other | Attending: Emergency Medicine | Admitting: Emergency Medicine

## 2022-06-04 ENCOUNTER — Other Ambulatory Visit: Payer: Self-pay

## 2022-06-04 DIAGNOSIS — J209 Acute bronchitis, unspecified: Secondary | ICD-10-CM

## 2022-06-04 DIAGNOSIS — R1012 Left upper quadrant pain: Secondary | ICD-10-CM | POA: Insufficient documentation

## 2022-06-04 DIAGNOSIS — K5732 Diverticulitis of large intestine without perforation or abscess without bleeding: Secondary | ICD-10-CM

## 2022-06-04 DIAGNOSIS — R110 Nausea: Secondary | ICD-10-CM | POA: Insufficient documentation

## 2022-06-04 LAB — COMPREHENSIVE METABOLIC PANEL
ALT: 21 U/L (ref 0–44)
AST: 45 U/L — ABNORMAL HIGH (ref 15–41)
Albumin: 3.7 g/dL (ref 3.5–5.0)
Alkaline Phosphatase: 40 U/L (ref 38–126)
Anion gap: 6 (ref 5–15)
BUN: 11 mg/dL (ref 6–20)
CO2: 21 mmol/L — ABNORMAL LOW (ref 22–32)
Calcium: 8.2 mg/dL — ABNORMAL LOW (ref 8.9–10.3)
Chloride: 110 mmol/L (ref 98–111)
Creatinine, Ser: 0.65 mg/dL (ref 0.44–1.00)
GFR, Estimated: >60 mL/min (ref >60)
Glucose, Bld: 117 mg/dL — ABNORMAL HIGH (ref 70–99)
Potassium: 4.4 mmol/L (ref 3.5–5.1)
Sodium: 137 mmol/L (ref 135–145)
Total Bilirubin: 2 mg/dL — ABNORMAL HIGH (ref 0.3–1.2)
Total Protein: 6.8 g/dL (ref 6.5–8.1)

## 2022-06-04 LAB — CBC
HCT: 38.3 % (ref 36.0–46.0)
Hemoglobin: 12.4 g/dL (ref 12.0–15.0)
MCH: 29.1 pg (ref 26.0–34.0)
MCHC: 32.4 g/dL (ref 30.0–36.0)
MCV: 89.9 fL (ref 80.0–100.0)
Platelets: 202 10*3/uL (ref 150–400)
RBC: 4.26 MIL/uL (ref 3.87–5.11)
RDW: 12.5 % (ref 11.5–15.5)
WBC: 9.3 10*3/uL (ref 4.0–10.5)
nRBC: 0 % (ref 0.0–0.2)

## 2022-06-04 LAB — HCG, QUANTITATIVE, PREGNANCY: hCG, Beta Chain, Quant, S: <1 m[IU]/mL (ref <5)

## 2022-06-04 LAB — TROPONIN I (HIGH SENSITIVITY)
Troponin I (High Sensitivity): <2 ng/L (ref <18)
Troponin I (High Sensitivity): <2 ng/L (ref <18)

## 2022-06-04 LAB — URINALYSIS, ROUTINE W REFLEX MICROSCOPIC
Bilirubin Urine: NEGATIVE
Glucose, UA: NEGATIVE mg/dL
Hgb urine dipstick: NEGATIVE
Ketones, ur: NEGATIVE mg/dL
Leukocytes,Ua: NEGATIVE
Nitrite: NEGATIVE
Protein, ur: NEGATIVE mg/dL
Specific Gravity, Urine: 1.01 (ref 1.005–1.030)
pH: 6 (ref 5.0–8.0)

## 2022-06-04 MED ORDER — ONDANSETRON HCL 4 MG/2ML IJ SOLN
4.0000 mg | INTRAMUSCULAR | Status: AC
  Administered 2022-06-04: 4 mg via INTRAVENOUS
  Filled 2022-06-04: qty 2

## 2022-06-04 MED ORDER — MORPHINE SULFATE (PF) 4 MG/ML IV SOLN
4.0000 mg | Freq: Once | INTRAVENOUS | Status: AC
  Administered 2022-06-04: 4 mg via INTRAVENOUS
  Filled 2022-06-04: qty 1

## 2022-06-04 MED ORDER — SODIUM CHLORIDE 0.9 % IV BOLUS
1000.0000 mL | Freq: Once | INTRAVENOUS | Status: AC
  Administered 2022-06-04: 1000 mL via INTRAVENOUS

## 2022-06-04 NOTE — ED Notes (Signed)
XR at bedside

## 2022-06-04 NOTE — ED Triage Notes (Signed)
Pt complains of severe abd pain, luq  with nausea. Pt appears in distress. Pt states pain began last pm and now has migrated to left shoulder. Pt denies shob.

## 2022-06-04 NOTE — ED Provider Notes (Signed)
[facility] Provider Note    Event Date/Time   First MD Initiated Contact with Patient 06/04/22 2118     (approximate)   History   Abdominal Pain   HPI  Amber Duffy is a 31 y.o. female who reports no major past medical history other than having an IUD and a previous miscarriage in the distant past.  IUD has been in place for 3 years.  Last night started developing left-sided upper abdominal pain with nausea but no vomiting no fevers no chills no chest pain or trouble breathing.  However she reports the pain in her abdomen is somewhat shooting towards her left shoulder area and down her left side flank area now as well  Pain is severe 10 out of 10 located on the left upper abdomen.  Denies vaginal bleeding or discharge.  Denies pregnancy.  No fevers or chills   Denies any urinary symptoms or urgency.  Denies any lower's abdominal symptoms or vaginal discharge  Physical Exam   Triage Vital Signs: ED Triage Vitals  Enc Vitals Group     BP 06/04/22 2115 (!) 228/98     Pulse Rate 06/04/22 2115 96     Resp 06/04/22 2115 (!) 24     Temp --      Temp src --      SpO2 06/04/22 2115 100 %     Weight 06/04/22 2116 190 lb (86.2 kg)     Height 06/04/22 2116 5\' 5"  (1.651 m)     Head Circumference --      Peak Flow --      Pain Score 06/04/22 2116 9     Pain Loc --      Pain Edu? --      Excl. in GC? --   Please note, triage nurse advised that patient was writhing about difficulty obtaining blood pressure initially.  As patient is in the bed now blood pressure is 120/90.  Suspect this first reading was inaccurate  Most recent vital signs: Vitals:   06/04/22 2300 06/04/22 2345  BP: (!) 95/54 111/75  Pulse: 71 80  Resp: 17 17  Temp:    SpO2: 100% 100%     General: Awake, with no respiratory distress but appears to be in painful distress reporting severe pain somewhat writhing in pain reports it is her left flank left upper abdomen.  Radiates slightly  towards her left shoulder CV:  Good peripheral perfusion.  Normal heart tones normal rate Resp:  Normal effort.  Clear bilaterally.  Normal work of breathing.  Speaks in full sentences without distress. Abd:  No distention.  Notably tender severe tenderness in the epigastrium and left upper quadrant.  Slight pain in the left flank.  No pain in the right upper quadrant or right lower abdomen or suprapubic region Other:     ED Results / Procedures / Treatments   Labs (all labs ordered are listed, but only abnormal results are displayed) Labs Reviewed  COMPREHENSIVE METABOLIC PANEL - Abnormal; Notable for the following components:      Result Value   CO2 21 (*)    Glucose, Bld 117 (*)    Calcium 8.2 (*)    AST 45 (*)    Total Bilirubin 2.0 (*)    All other components within normal limits  CBC  URINALYSIS, ROUTINE W REFLEX MICROSCOPIC  HCG, QUANTITATIVE, PREGNANCY  D-DIMER, QUANTITATIVE (NOT AT [facility])  TROPONIN I (HIGH SENSITIVITY)  TROPONIN I (HIGH SENSITIVITY)  EKG  Interpreted by me at 2130 heart rate 90 QRS 90 QTc 440 Normal sinus rhythm no evidence of acute ischemia or ectopy.   RADIOLOGY  Chest x-ray interpreted by me as negative for acute finding  CT imaging orders are pending at time of signout to Dr. Kerman Passey.  Anticipate need for CT abdomen pelvis, but also awaiting D-dimer.  If D-dimer is elevated would also perform CT PE study   PROCEDURES:  Critical Care performed: No  Procedures   MEDICATIONS ORDERED IN ED: Medications  morphine (PF) 4 MG/ML injection 4 mg (4 mg Intravenous Given 06/04/22 2134)  ondansetron (ZOFRAN) injection 4 mg (4 mg Intravenous Given 06/04/22 2134)  sodium chloride 0.9 % bolus 1,000 mL (1,000 mLs Intravenous New Bag/Given 06/04/22 2155)     IMPRESSION / MDM / ASSESSMENT AND PLAN / ED COURSE  I reviewed the triage vital signs and the nursing notes.                              Differential diagnosis includes but is not  limited to, abdominal perforation, aortic dissection, cholecystitis, appendicitis, diverticulitis, colitis, esophagitis/gastritis, kidney stone, pyelonephritis, urinary tract infection, aortic aneurysm. All are considered in decision and treatment plan. Based upon the patient's presentation and risk factors, anticipate proceed with CT imaging to further evaluate as the patient has remarkably severe pain in in the left side left upper abdomen.  Additionally, evaluate cardiac work-up.  EKG pending.  She reports the pain radiates towards her left shoulder and thus have also ordered D-dimer to evaluate for risk for pulmonary embolism, if this is elevated would anticipate CT imaging to include the chest to rule out PE as a potential etiology though seems somewhat unlikely with normal pulse oximetry no tachycardia and patient not having any chest pain but rather reports what sounds more like a referred pain to the left shoulder  D-dimer pending at signout-patient has no noted risk factors for pulmonary embolism or thromboembolic disease.  No leg swelling.  No recent surgeries.  No history of estrogen use has an IUD only etc.   Patient's presentation is most consistent with acute presentation with potential threat to life or bodily function.  The patient is on the cardiac monitor to evaluate for evidence of arrhythmia and/or significant heart rate changes.  Clinical Course as of 06/04/22 2357  Sat Jun 04, 2022  2235 Pain well controlled.  Patient resting much more comfortably at this time. [MQ]    Clinical Course User Index [MQ] Delman Kitten, MD   ----------------------------------------- 11:33 PM on 06/04/2022 ----------------------------------------- Ongoing care assigned to Dr. Kerman Passey.  Follow-up on pending results including hCG, D-dimer.  Thereafter if D-dimer is exclusionary for PE would perform CT abdomen pelvis to further work-up for etiology of what appears to be apparent left flank and  left upper quadrant pain, however if D-dimer is elevated would also add CT PE study.  Comprehensive metabolic panel noted for minimally elevated AST.  Minimally elevated bilirubin.  CBC normal.  Troponin normal and negative pregnancy test  FINAL CLINICAL IMPRESSION(S) / ED DIAGNOSES   Final diagnoses:  Abdominal pain, left upper quadrant  diagnosis above is quite provisional, further work-up is planned and will be followed up by Dr. Kerman Passey   Rx / DC Orders   ED Discharge Orders     None        Note:  This document was prepared using Dragon voice recognition software and  may include unintentional dictation errors.   Sharyn Creamer, MD 06/04/22 [phone]

## 2022-06-05 ENCOUNTER — Emergency Department: Payer: Medicaid Other

## 2022-06-05 LAB — D-DIMER, QUANTITATIVE: D-Dimer, Quant: 0.51 ug/mL-FEU — ABNORMAL HIGH (ref 0.00–0.50)

## 2022-06-05 MED ORDER — IOHEXOL 350 MG/ML SOLN
75.0000 mL | Freq: Once | INTRAVENOUS | Status: AC | PRN
  Administered 2022-06-05: 75 mL via INTRAVENOUS

## 2022-06-05 MED ORDER — AMOXICILLIN-POT CLAVULANATE 875-125 MG PO TABS: 1.0000 | ORAL_TABLET | Freq: Two times a day (BID) | ORAL | 0 refills | Status: DC

## 2022-06-05 MED ORDER — HYDROCODONE-ACETAMINOPHEN 5-325 MG PO TABS: 1.0000 | ORAL_TABLET | ORAL | 0 refills | Status: DC | PRN

## 2022-06-05 MED ORDER — AMOXICILLIN-POT CLAVULANATE 875-125 MG PO TABS
1.0000 | ORAL_TABLET | Freq: Once | ORAL | Status: AC
  Administered 2022-06-05: 1 via ORAL
  Filled 2022-06-05: qty 1

## 2022-06-05 NOTE — ED Notes (Signed)
Requested float RN Alex assistance in obtaining repeat d-dimer.

## 2022-06-05 NOTE — ED Provider Notes (Signed)
-----------------------------------------   2:25 AM on 06/05/2022 -----------------------------------------  Patient CTA of the chest is negative for PE but does show mild bronchial inflammation possibly indicating acute bronchitis.  CT scan of the abdomen/pelvis shows left-sided mild diverticulitis which is likely the cause of the patient's left-sided abdominal pain.  We will start the patient on Augmentin twice daily for the next 10 days we will prescribe a short course of hydrocodone for the patient we will have her follow-up with her doctor.  Provided my typical return precautions.  Patient agreeable to plan of care and is ready to go home per patient.   Harvest Dark, MD 06/05/22 [phone]

## 2022-06-05 NOTE — ED Notes (Signed)
Pt transported to CT ?

## 2022-06-05 NOTE — ED Notes (Signed)
Discharge instructions were discussed with pt. Pt verbalized understanding with no questions at this time. Pt to go home with husband at bedside.

## 2022-06-13 ENCOUNTER — Other Ambulatory Visit: Payer: Self-pay | Admitting: Family

## 2022-06-13 DIAGNOSIS — N920 Excessive and frequent menstruation with regular cycle: Secondary | ICD-10-CM

## 2022-06-20 ENCOUNTER — Ambulatory Visit
Admission: RE | Admit: 2022-06-20 | Discharge: 2022-06-20 | Disposition: A | Discharge status: Home / Self Care | Payer: Medicaid Other | Source: Ambulatory Visit | Attending: Family | Admitting: Family

## 2022-06-20 DIAGNOSIS — N920 Excessive and frequent menstruation with regular cycle: Secondary | ICD-10-CM | POA: Insufficient documentation

## 2022-12-03 ENCOUNTER — Other Ambulatory Visit: Payer: Self-pay | Admitting: Family

## 2022-12-13 ENCOUNTER — Ambulatory Visit: Payer: BC Managed Care – PPO | Admitting: Family

## 2023-02-03 ENCOUNTER — Ambulatory Visit (INDEPENDENT_AMBULATORY_CARE_PROVIDER_SITE_OTHER): Payer: BC Managed Care – PPO | Admitting: Family

## 2023-02-03 ENCOUNTER — Encounter: Payer: Self-pay | Admitting: Family

## 2023-02-03 VITALS — BP 115/70 | HR 88 | Ht 62.0 in | Wt 200.0 lb

## 2023-02-03 DIAGNOSIS — J029 Acute pharyngitis, unspecified: Secondary | ICD-10-CM

## 2023-02-03 DIAGNOSIS — T161XXA Foreign body in right ear, initial encounter: Secondary | ICD-10-CM

## 2023-02-03 NOTE — Progress Notes (Signed)
   Acute Office Visit  Subjective:     Patient ID: Amber Duffy, female    DOB: [birth year], 32 y.o.   MRN: [id number]  Patient is in today for  Chief Complaint  Patient presents with  . Acute Visit    Sore throat x2 days/ear ache today    HPI   ROS      Objective:    BP 115/70   Pulse 88   Ht 5\' 2"  (1.575 m)   Wt 200 lb (90.7 kg)   SpO2 97%   BMI 36.58 kg/m   Physical Exam  No results found for any visits on 02/03/23.  No results found for this or any previous visit (from the past 2160 hour(s)).     Assessment & Plan:   Problem List Items Addressed This Visit   None    No follow-ups on file.  Total time spent: {AMA time spent:29001} minutes  Miki Kins, FNP  02/03/2023   This document may have been prepared by [facility] Voice Recognition software and as such may include unintentional dictation errors.

## 2023-02-04 ENCOUNTER — Encounter: Payer: Self-pay | Admitting: Family

## 2023-02-16 ENCOUNTER — Ambulatory Visit: Payer: BC Managed Care – PPO | Admitting: Family

## 2023-03-31 IMAGING — US US PELVIS COMPLETE WITH TRANSVAGINAL
1 series · 13 of 25 positions shown · non-contrast
Comparison: 11/13/2018

CLINICAL DATA: BILATERAL adnexal pain for 2 weeks, has Mirena IUD

EXAM:
TRANSABDOMINAL AND TRANSVAGINAL ULTRASOUND OF PELVIS
TECHNIQUE: Both transabdominal and transvaginal ultrasound examinations of the
pelvis were performed. Transabdominal technique was performed for
global imaging of the pelvis including uterus, ovaries, adnexal
regions, and pelvic cul-de-sac. It was necessary to proceed with
endovaginal exam following the transabdominal exam to visualize the
IUD, endometrium, and adnexa.

[Series 1: us pelvis complete with transvaginal · 0.22mm/px · 13 of 111 slices shown]
[im 1/111]
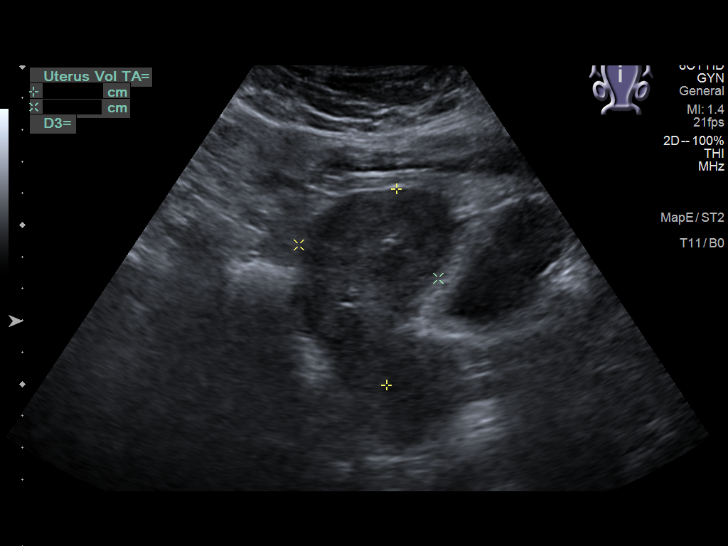
[im 10/111]
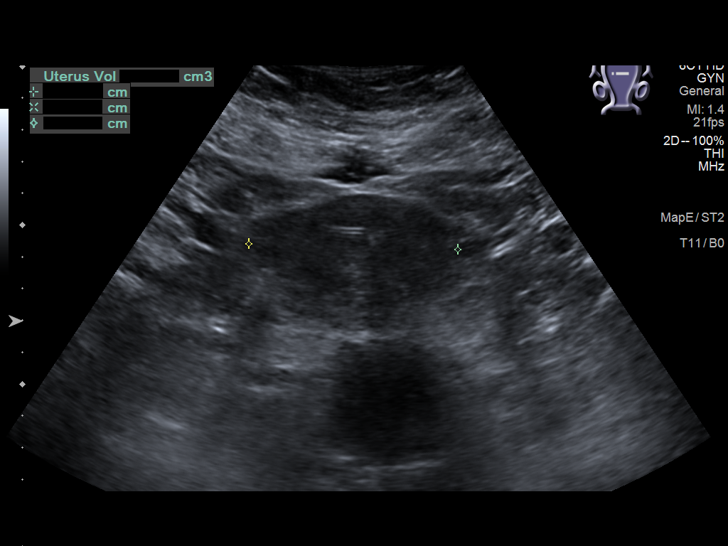
[im 19/111]
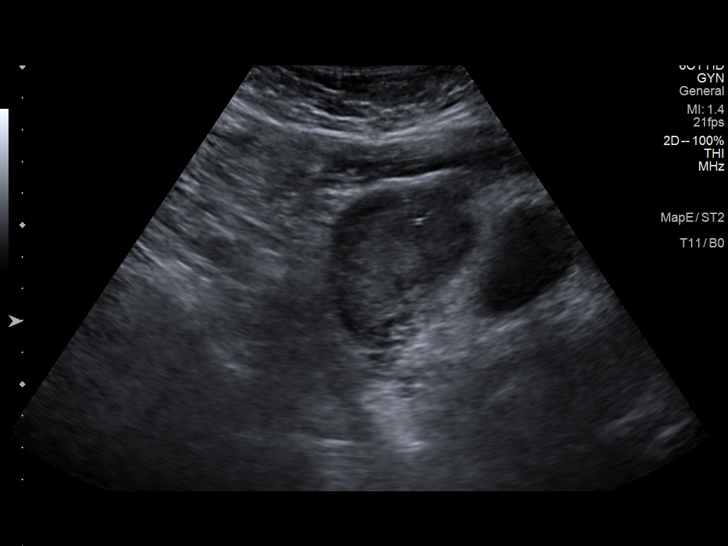
[im 28/111]
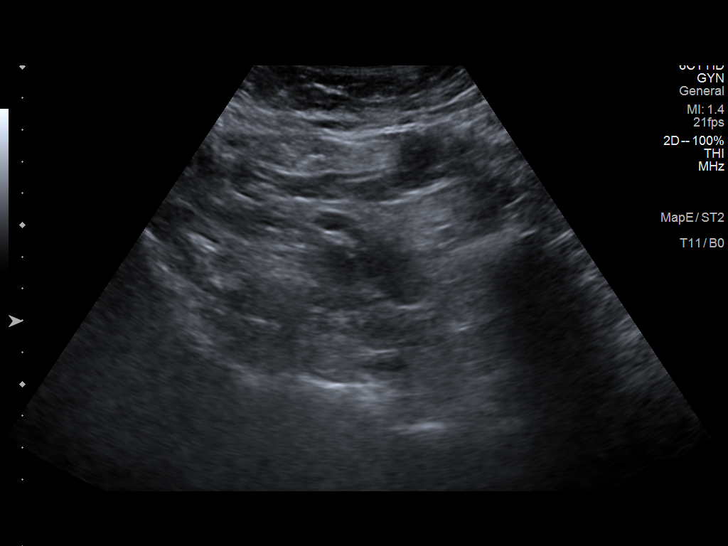
[im 37/111]
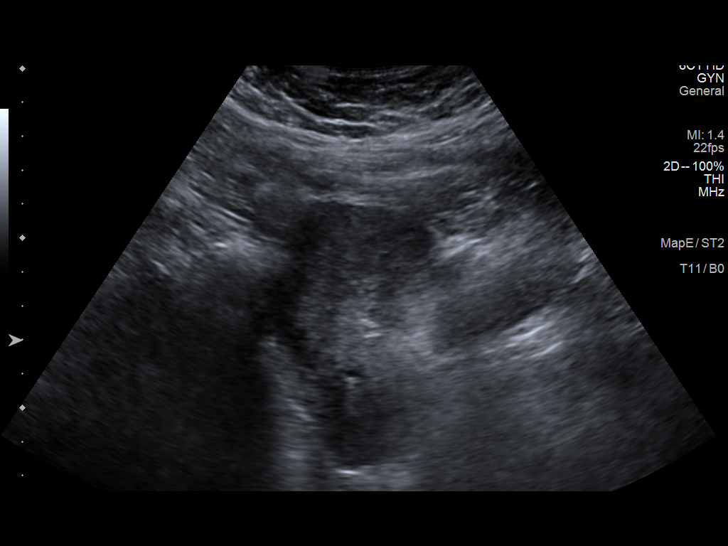
[im 46/111]
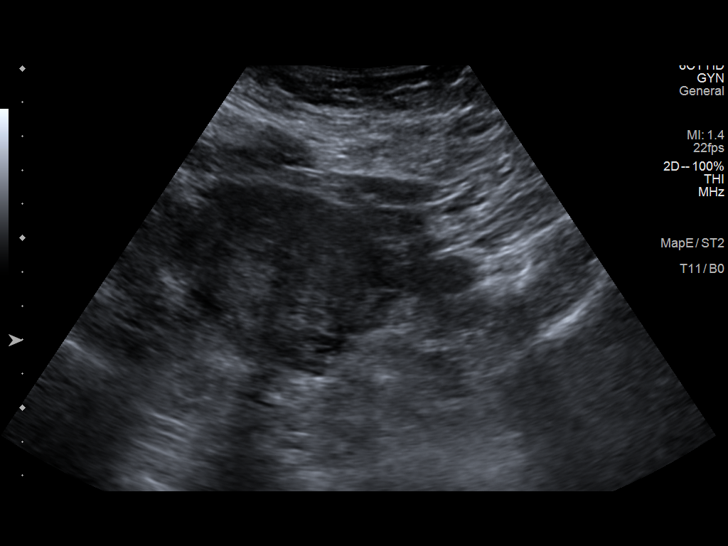
[im 56/111]
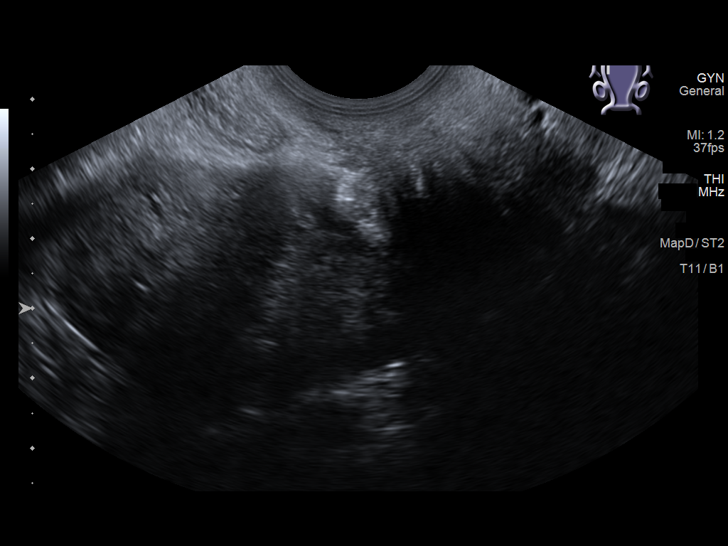
[im 65/111]
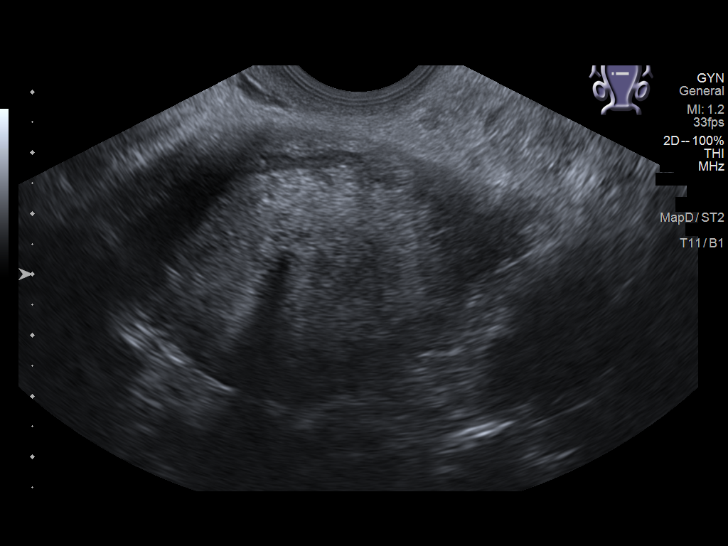
[im 74/111]
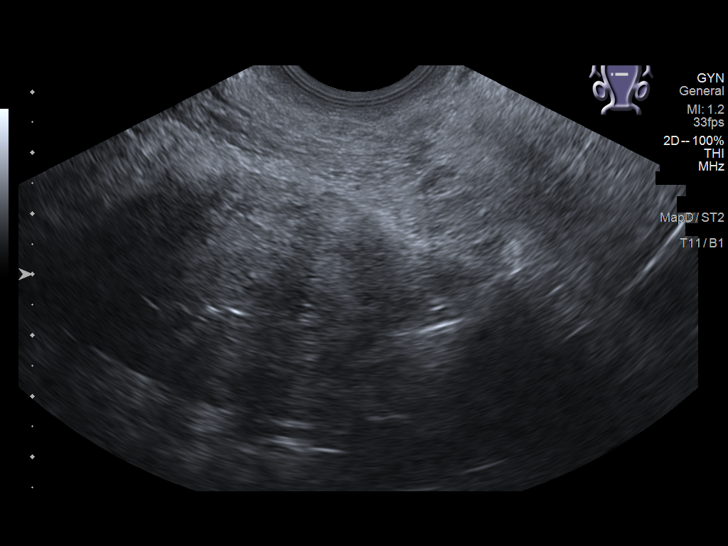
[im 83/111]
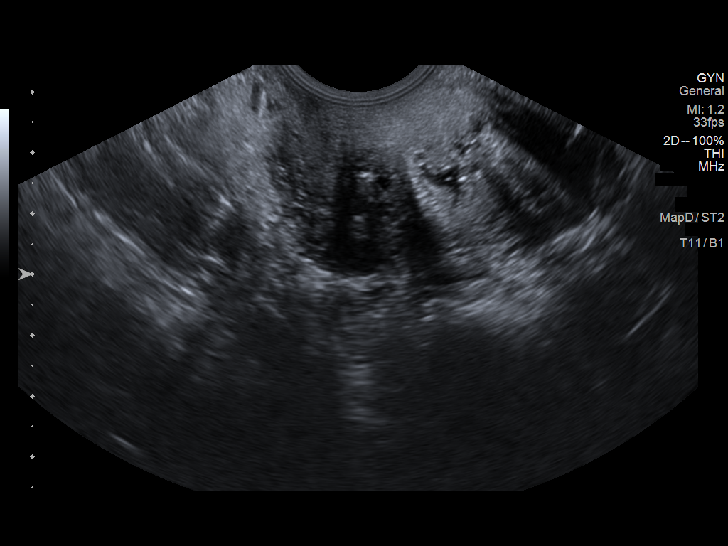
[im 92/111]
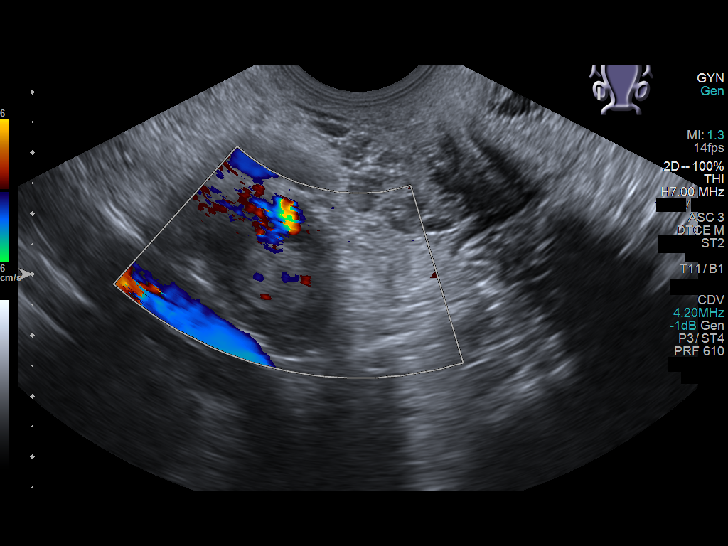
[im 101/111]
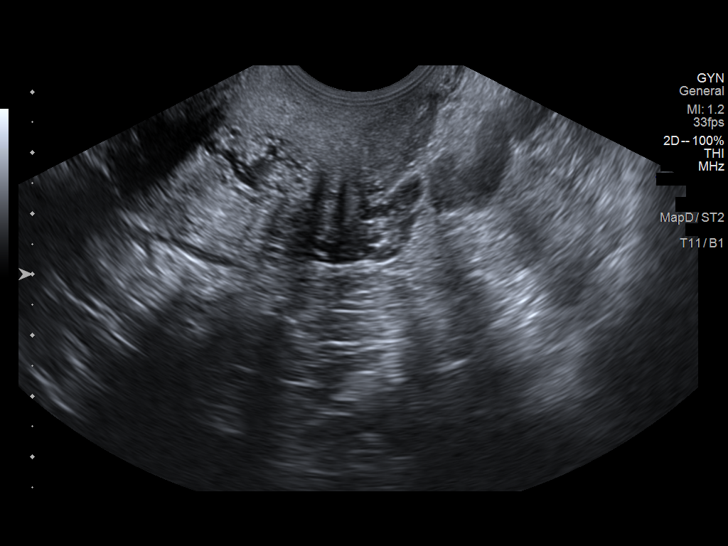
[im 111/111]
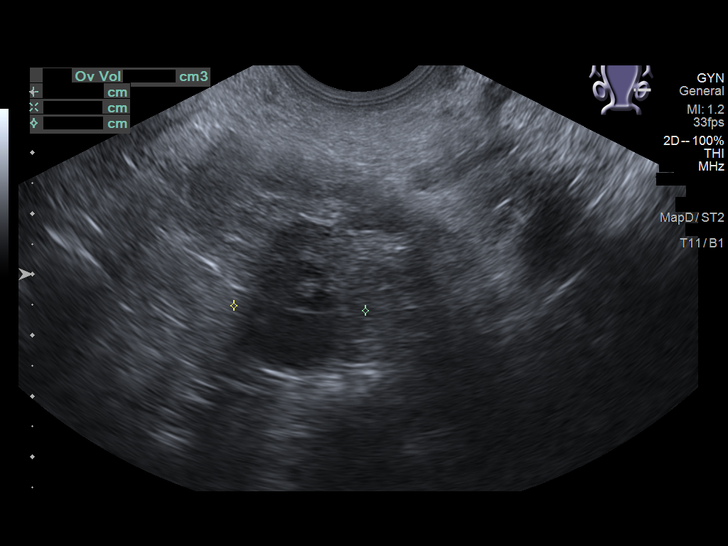

[13 of 25 positions shown; findings below may reference images not displayed]

FINDINGS: Uterus

Measurements: 6.7 x 4.3 x 5.3 cm = volume: 81 mL. Anteverted.
Anterior wall Caesarean section scar. No focal mass.

Endometrium

Thickness: 6 mm. IUD in expected position at upper uterine segment
endometrial canal. Trace endometrial fluid. No focal mass.

Right ovary

Measurements: 2.8 x 1.9 x 1.5 cm = volume: 4.1 mL. Normal morphology
without mass

Left ovary

Measurements: 3.3 x 2.6 x 2.2 cm = volume: 9.6 mL. Normal morphology
without mass

Other findings

No free pelvic fluid. No adnexal masses. Trace nonspecific
endocervical fluid.
IMPRESSION: IUD in expected position at upper uterine segment endometrial canal
with trace nonspecific endometrial fluid noted.

Remainder of exam normal.

## 2023-04-04 ENCOUNTER — Encounter: Payer: Self-pay | Admitting: Obstetrics and Gynecology

## 2023-04-04 ENCOUNTER — Ambulatory Visit (INDEPENDENT_AMBULATORY_CARE_PROVIDER_SITE_OTHER): Payer: BC Managed Care – PPO | Admitting: Obstetrics and Gynecology

## 2023-04-04 VITALS — BP 106/70 | Ht 65.0 in | Wt 205.0 lb

## 2023-04-04 DIAGNOSIS — Z01419 Encounter for gynecological examination (general) (routine) without abnormal findings: Secondary | ICD-10-CM

## 2023-04-04 DIAGNOSIS — Z30432 Encounter for removal of intrauterine contraceptive device: Secondary | ICD-10-CM

## 2023-04-04 DIAGNOSIS — Z8041 Family history of malignant neoplasm of ovary: Secondary | ICD-10-CM

## 2023-04-04 DIAGNOSIS — Z3169 Encounter for other general counseling and advice on procreation: Secondary | ICD-10-CM

## 2023-04-04 NOTE — Progress Notes (Addendum)
PCP:  Miki Kins, FNP   Chief Complaint  Patient presents with   Gynecologic Exam    IUD removal, plans to conceive     HPI:      [name] is a 32 y.o. [phone] whose LMP was No LMP recorded. (Menstrual status: IUD)., presents today for her annual examination.  Her menses are absent with IUD. Rare BTB. Having issues with acne, on minocycline, tretinoin, and DIM. Plans to stop them for conception.   Sex activity: single partner, contraception - IUD. Mirena placed 02/07/17 at [facility] visit. Would like removed for conception. Not taking PNVs. 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 Was told SAB due to hormones but prog not used with subsequent pregnancies.  Last Pap: 09/29/21  Results were: no abnormalities /neg HPV DNA 2021  There is no FH of breast cancer. There is a FH of ovarian cancer on both sides as well as pancreatic cancer on her mat side. Genetic testing not done. The patient does not do self-breast exams.  Tobacco use: The patient denies current or previous tobacco use. Alcohol use: none No drug use.  Exercise: min active  She does get adequate calcium but not Vitamin D in her diet. Hx of Vit D deficiency with recent labs with PCP. Not taking supp.   Patient Active Problem List   Diagnosis Date Noted   IUD (intrauterine device) in place 09/29/2021   Postoperative anemia due to acute blood loss 12/28/2016   Maternal varicella, non-immune 12/28/2016   BMI 33.0-33.9,adult 12/14/2016    Past Surgical History:  Procedure Laterality Date   CESAREAN SECTION N/A 12/26/2016   Procedure: CESAREAN SECTION;  Surgeon: Vena Austria, MD;  Location: [facility] ORS;  Service: Obstetrics;  Laterality: N/A;   DILATION AND CURETTAGE OF UTERUS     DILATION AND EVACUATION N/A 02/26/2015   Procedure: DILATATION AND EVACUATION;  Surgeon: Nadara Mustard, MD;  Location: [facility] ORS;  Service: Gynecology;  Laterality: N/A;   DILATION AND EVACUATION N/A  05/26/2015   Procedure: DILATATION AND EVACUATION;  Surgeon: Longstreet Bing, MD;  Location: [facility] ORS;  Service: Gynecology;  Laterality: N/A;    Family History  Problem Relation Age of Onset   Pancreatic cancer Maternal Aunt    Cancer Maternal Aunt 51       ovar vs pancreatic--unknown primary   Ovarian cancer Maternal Aunt    Ovarian cancer Paternal Aunt        24s   Ovarian cancer Maternal Grandmother        unsure of age   Pancreatic cancer Maternal Grandmother 64   Lung cancer Other 48   Ovarian cancer Cousin        late 30s   Breast cancer Neg Hx     Social History   Socioeconomic History   Marital status: Single    Spouse name: Not on file   Number of children: 1   Years of education: Not on file   Highest education level: Not on file  Occupational History   Not on file  Tobacco Use   Smoking status: Never   Smokeless tobacco: Never  Vaping Use   Vaping status: Never Used  Substance and Sexual Activity   Alcohol use: No   Drug use: No   Sexual activity: Yes    Birth control/protection: I.U.D.    Comment: Mirena  Other Topics Concern   Not on file  Social History Narrative  Not on file   Social Determinants of Health   Financial Resource Strain: Not on file  Food Insecurity: Not on file  Transportation Needs: Not on file  Physical Activity: Not on file  Stress: Not on file  Social Connections: Not on file  Intimate Partner Violence: Not on file     Current Outpatient Medications:    cyanocobalamin (VITAMIN B12) 1000 MCG/ML injection, INJECT INTO MUSCLE ONCE MONTHLY, Disp: 3 mL, Rfl: 1   levonorgestrel (MIRENA) 20 MCG/24HR IUD, 1 each by Intrauterine route once., Disp: , Rfl:    minocycline (MINOCIN) 50 MG capsule, Take 1 tablet once daily. Take with food and avoid laying down for 30 minutes after taking medication., Disp: , Rfl:    tretinoin (RETIN-A) 0.025 % cream, Apply to face nightly. Use a pea sized amount, avoiding the eyelids. Start 2  nights a week and increase in frequency as dryness allows., Disp: , Rfl:      ROS:  Review of Systems  Constitutional:  Negative for fatigue, fever and unexpected weight change.  Respiratory:  Negative for cough, shortness of breath and wheezing.   Cardiovascular:  Negative for chest pain, palpitations and leg swelling.  Gastrointestinal:  Negative for blood in stool, constipation, diarrhea, nausea and vomiting.  Endocrine: Negative for cold intolerance, heat intolerance and polyuria.  Genitourinary:  Negative for dyspareunia, dysuria, flank pain, frequency, genital sores, hematuria, menstrual problem, pelvic pain, urgency, vaginal bleeding, vaginal discharge and vaginal pain.  Musculoskeletal:  Negative for back pain, joint swelling and myalgias.  Skin:  Negative for rash.  Neurological:  Negative for dizziness, syncope, light-headedness, numbness and headaches.  Hematological:  Negative for adenopathy.  Psychiatric/Behavioral:  Negative for agitation, confusion, sleep disturbance and suicidal ideas. The patient is not nervous/anxious.    BREAST: No symptoms   Objective: BP 106/70   Ht 5\' 5"  (1.651 m)   Wt 205 lb (93 kg)   BMI 34.11 kg/m    Physical Exam Constitutional:      Appearance: She is well-developed.  Genitourinary:     Vulva normal.     Right Labia: No rash, tenderness or lesions.    Left Labia: No tenderness, lesions or rash.    No vaginal discharge, erythema or tenderness.      Right Adnexa: not tender and no mass present.    Left Adnexa: not tender and no mass present.    No cervical friability or polyp.     IUD strings visualized.     Uterus is not enlarged or tender.  Breasts:    Right: No mass, nipple discharge, skin change or tenderness.     Left: No mass, nipple discharge, skin change or tenderness.  Neck:     Thyroid: No thyromegaly.  Cardiovascular:     Rate and Rhythm: Normal rate and regular rhythm.     Heart sounds: Normal heart sounds. No  murmur heard. Pulmonary:     Effort: Pulmonary effort is normal.     Breath sounds: Normal breath sounds.  Abdominal:     Palpations: Abdomen is soft.     Tenderness: There is no abdominal tenderness. There is no guarding or rebound.  Musculoskeletal:        General: Normal range of motion.     Cervical back: Normal range of motion.  Lymphadenopathy:     Cervical: No cervical adenopathy.  Neurological:     General: No focal deficit present.     Mental Status: She is alert and oriented  to person, place, and time.     Cranial Nerves: No cranial nerve deficit.  Skin:    General: Skin is warm and dry.  Psychiatric:        Mood and Affect: Mood normal.        Behavior: Behavior normal.        Thought Content: Thought content normal.        Judgment: Judgment normal.  Vitals reviewed.    IUD Removal Strings of IUD identified and grasped.  IUD removed without problem with ring forceps.  Pt tolerated this well.  IUD noted to be intact.   Assessment/Plan: Encounter for annual routine gynecological examination  Family history of ovarian cancer - Plan: [facility]); MyRisk testing discussed and done today. Discussed mutation and increased risk of cancers and preventive and screening options through [facility]. Aware if testing neg then pt isn't as increased risk based on FH. Will f/u with results and mgmt.   Pre-conception counseling--start PNVs, d/c acne meds. F/u prn. Pt to schedule NOB prn positive preg test and make office aware of hx of SAB.   Encounter for IUD removal            GYN counsel adequate intake of calcium and vitamin D, diet and exercise     F/U  Return in about 1 year (around 04/03/2024).  Sallyann Kinnaird B. Bryon Parker, PA-C 04/04/2023 4:37 PM

## 2023-04-04 NOTE — Addendum Note (Signed)
Addended by: Althea Grimmer B on: 04/04/2023 04:40 PM   Modules accepted: Level of Service

## 2023-04-04 NOTE — Patient Instructions (Signed)
I value your feedback and you entrusting us with your care. If you get a [facility] patient survey, I would appreciate you taking the time to let us know about your experience today. Thank you! ? ? ?

## 2023-04-11 DIAGNOSIS — Z1371 Encounter for nonprocreative screening for genetic disease carrier status: Secondary | ICD-10-CM

## 2023-04-11 HISTORY — DX: Encounter for nonprocreative screening for genetic disease carrier status: Z13.71

## 2023-04-25 ENCOUNTER — Encounter: Payer: Self-pay | Admitting: Obstetrics and Gynecology

## 2023-04-27 ENCOUNTER — Telehealth: Payer: Self-pay | Admitting: Obstetrics and Gynecology

## 2023-04-27 NOTE — Telephone Encounter (Signed)
This patient is returning missed call from Amber Duffy. Please advise?

## 2023-04-27 NOTE — Telephone Encounter (Signed)
Pt aware of neg MyRisk results. No further screening recommendations indicated.   Patient understands these results only apply to her and her children, and this is not indicative of genetic testing results of her other family members. It is recommended that her other family members have genetic testing done.  Pt also understands negative genetic testing doesn't mean she will never get any of these cancers.   Hard copy mailed to pt. F/u prn.

## 2023-06-19 ENCOUNTER — Encounter: Payer: Self-pay | Admitting: Obstetrics & Gynecology

## 2023-06-19 ENCOUNTER — Ambulatory Visit (INDEPENDENT_AMBULATORY_CARE_PROVIDER_SITE_OTHER): Payer: BC Managed Care – PPO | Admitting: Obstetrics & Gynecology

## 2023-06-19 VITALS — BP 85/58 | Temp 89.0°F | Ht 63.0 in | Wt 201.0 lb

## 2023-06-19 DIAGNOSIS — Z3202 Encounter for pregnancy test, result negative: Secondary | ICD-10-CM

## 2023-06-19 DIAGNOSIS — Z30430 Encounter for insertion of intrauterine contraceptive device: Secondary | ICD-10-CM | POA: Diagnosis not present

## 2023-06-19 LAB — POCT URINE PREGNANCY: Preg Test, Ur: NEGATIVE

## 2023-06-19 MED ORDER — LEVONORGESTREL 20 MCG/DAY IU IUD
1.0000 | INTRAUTERINE_SYSTEM | Freq: Once | INTRAUTERINE | Status: AC
  Administered 2023-06-19: 1 via INTRAUTERINE

## 2023-06-19 NOTE — Progress Notes (Signed)
    GYNECOLOGY PROGRESS NOTE  Subjective:    Patient ID: Amber Duffy, female    DOB: March 27, 1991, 32 y.o.   MRN: [id number]  HPI  Patient is a 32 y.o. married [id number] who is here to have a Mirena IUD inserted. She had her last Mirena removed about 3 months ago because she wanted to conceive. She has changed her mind and wants the Mirena re inserted. She has been using condoms since her LMP at the end of September.  The following portions of the patient's history were reviewed and updated as appropriate: allergies, current medications, past family history, past medical history, past social history, past surgical history, and problem list.  Review of Systems Pertinent items are noted in HPI.  Her last pap was 05/2022 and was normal.  Objective:   Blood pressure (!) 85/58, temperature (!) 89 F (31.7 C), height 5\' 3"  (1.6 m), weight 201 lb (91.2 kg), last menstrual period 06/05/2023. Body mass index is 35.61 kg/m. Well nourished, well hydrated Latina, no apparent distress   UPT negative, consent signed, Time out procedure done. Bimanual exam revealed a uterus NSSA, no adnexal masses or tenderness Cervix prepped with betadine and Hurricaine spray and then grasped with a single tooth tenaculum. Mirena was easily placed and the strings were cut to 3-4 cm. Uterus sounded to 8 cm. She tolerated the procedure well.   Assessment:   Contraception - Mirena  Plan:   I recommended condoms for the next 2 weeks.

## 2023-07-24 ENCOUNTER — Ambulatory Visit: Payer: BC Managed Care – PPO | Admitting: Obstetrics & Gynecology

## 2023-11-16 ENCOUNTER — Ambulatory Visit: Payer: Medicaid Other | Admitting: Family

## 2023-11-17 ENCOUNTER — Ambulatory Visit (INDEPENDENT_AMBULATORY_CARE_PROVIDER_SITE_OTHER): Admitting: Family

## 2023-11-17 ENCOUNTER — Encounter: Payer: Self-pay | Admitting: Family

## 2023-11-17 VITALS — BP 104/66 | HR 75 | Ht 62.0 in | Wt 210.4 lb

## 2023-11-17 DIAGNOSIS — R5383 Other fatigue: Secondary | ICD-10-CM

## 2023-11-17 DIAGNOSIS — I10 Essential (primary) hypertension: Secondary | ICD-10-CM

## 2023-11-17 DIAGNOSIS — E782 Mixed hyperlipidemia: Secondary | ICD-10-CM

## 2023-11-17 DIAGNOSIS — R7303 Prediabetes: Secondary | ICD-10-CM

## 2023-11-17 DIAGNOSIS — R0689 Other abnormalities of breathing: Secondary | ICD-10-CM

## 2023-11-17 DIAGNOSIS — R0681 Apnea, not elsewhere classified: Secondary | ICD-10-CM

## 2023-11-17 DIAGNOSIS — E559 Vitamin D deficiency, unspecified: Secondary | ICD-10-CM

## 2023-11-17 DIAGNOSIS — Z6833 Body mass index (BMI) 33.0-33.9, adult: Secondary | ICD-10-CM

## 2023-11-17 DIAGNOSIS — E538 Deficiency of other specified B group vitamins: Secondary | ICD-10-CM

## 2023-11-17 MED ORDER — FLUTICASONE PROPIONATE 50 MCG/ACT NA SUSP: 2.0000 | Freq: Every day | NASAL | 2 refills | Status: DC

## 2023-11-18 LAB — CBC WITH DIFFERENTIAL/PLATELET
Basophils Absolute: 0.1 10*3/uL (ref 0.0–0.2)
Basos: 1 %
EOS (ABSOLUTE): 0.2 10*3/uL (ref 0.0–0.4)
Eos: 3 %
Hematocrit: 38.4 % (ref 34.0–46.6)
Hemoglobin: 12.8 g/dL (ref 11.1–15.9)
Immature Grans (Abs): 0 10*3/uL (ref 0.0–0.1)
Immature Granulocytes: 0 %
Lymphocytes Absolute: 2.2 10*3/uL (ref 0.7–3.1)
Lymphs: 30 %
MCH: 29.8 pg (ref 26.6–33.0)
MCHC: 33.3 g/dL (ref 31.5–35.7)
MCV: 90 fL (ref 79–97)
Monocytes Absolute: 0.8 10*3/uL (ref 0.1–0.9)
Monocytes: 11 %
Neutrophils Absolute: 4 10*3/uL (ref 1.4–7.0)
Neutrophils: 55 %
Platelets: 236 10*3/uL (ref 150–450)
RBC: 4.29 x10E6/uL (ref 3.77–5.28)
RDW: 12.7 % (ref 11.7–15.4)
WBC: 7.2 10*3/uL (ref 3.4–10.8)

## 2023-11-18 LAB — LIPID PANEL
Chol/HDL Ratio: 3.2 ratio (ref 0.0–4.4)
Cholesterol, Total: 151 mg/dL (ref 100–199)
HDL: 47 mg/dL (ref >39)
LDL Chol Calc (NIH): 88 mg/dL (ref 0–99)
Triglycerides: 84 mg/dL (ref 0–149)
VLDL Cholesterol Cal: 16 mg/dL (ref 5–40)

## 2023-11-18 LAB — CMP14+EGFR
ALT: 13 IU/L (ref 0–32)
AST: 15 IU/L (ref 0–40)
Albumin: 4.5 g/dL (ref 3.9–4.9)
Alkaline Phosphatase: 71 IU/L (ref 44–121)
BUN/Creatinine Ratio: 14 (ref 9–23)
BUN: 10 mg/dL (ref 6–20)
Bilirubin Total: 0.4 mg/dL (ref 0.0–1.2)
CO2: 25 mmol/L (ref 20–29)
Calcium: 9.2 mg/dL (ref 8.7–10.2)
Chloride: 102 mmol/L (ref 96–106)
Creatinine, Ser: 0.71 mg/dL (ref 0.57–1.00)
Globulin, Total: 2.6 g/dL (ref 1.5–4.5)
Glucose: 107 mg/dL — ABNORMAL HIGH (ref 70–99)
Potassium: 3.8 mmol/L (ref 3.5–5.2)
Sodium: 140 mmol/L (ref 134–144)
Total Protein: 7.1 g/dL (ref 6.0–8.5)
eGFR: 115 mL/min/{1.73_m2} (ref >59)

## 2023-11-18 LAB — VITAMIN B12: Vitamin B-12: 637 pg/mL (ref 232–1245)

## 2023-11-18 LAB — HEMOGLOBIN A1C
Est. average glucose Bld gHb Est-mCnc: 120 mg/dL
Hgb A1c MFr Bld: 5.8 % — ABNORMAL HIGH (ref 4.8–5.6)

## 2023-11-18 LAB — VITAMIN D 25 HYDROXY (VIT D DEFICIENCY, FRACTURES): Vit D, 25-Hydroxy: 16.6 ng/mL — ABNORMAL LOW (ref 30.0–100.0)

## 2023-11-18 LAB — TSH: TSH: 0.903 u[IU]/mL (ref 0.450–4.500)

## 2023-11-23 ENCOUNTER — Other Ambulatory Visit: Payer: Self-pay

## 2023-11-23 ENCOUNTER — Telehealth: Payer: Self-pay

## 2023-11-23 MED ORDER — VITAMIN D (ERGOCALCIFEROL) 1.25 MG (50000 UNIT) PO CAPS: 50000.0000 [IU] | ORAL_CAPSULE | ORAL | 3 refills | Status: DC

## 2023-11-23 MED ORDER — CYANOCOBALAMIN 1000 MCG/ML IJ SOLN: 1000.0000 ug | INTRAMUSCULAR | 1 refills | Status: DC

## 2023-12-10 ENCOUNTER — Encounter: Payer: Self-pay | Admitting: Family

## 2023-12-10 DIAGNOSIS — E538 Deficiency of other specified B group vitamins: Secondary | ICD-10-CM | POA: Insufficient documentation

## 2023-12-10 DIAGNOSIS — E559 Vitamin D deficiency, unspecified: Secondary | ICD-10-CM | POA: Insufficient documentation

## 2023-12-10 NOTE — Progress Notes (Signed)
 Established Patient Office Visit  Subjective:  Patient ID: Amber Duffy, female    DOB: 10/25/1990  Age: 33 y.o. MRN: [id number]  Chief Complaint  Patient presents with   Follow-up    Discuss B12 injections    Patient is here today for follow up.  She has been feeling fairly well since last appointment.   She does have additional concerns to discuss today.  She is concerned that she has been having some severe sleepiness during the day, and her family has told her that she is snoring and stops breathing during her sleep.  She has also had some episodes where she wakes up gasping for air.   Labs are due today. She needs refills.   I have reviewed her active problem list, medication list, allergies, health maintenance, notes from last encounter, lab results for her appointment today.      No other concerns at this time.   Past Medical History:  Diagnosis Date   BRCA negative 04/11/2023   MyRisk neg; IBIS=8.9%/riskscore=6.8%   Family history of ovarian cancer    Family history of pancreatic cancer    Migraine    with aura   Missed abortions 2010   Obesity (BMI 35.0-39.9 without comorbidity)    Postpartum care following cesarean delivery 12/29/2016   Status post cesarean section 12/28/2016    Past Surgical History:  Procedure Laterality Date   CESAREAN SECTION N/A 12/26/2016   Procedure: CESAREAN SECTION;  Surgeon: Vena Austria, MD;  Location: [facility];  Service: Obstetrics;  Laterality: N/A;   DILATION AND CURETTAGE OF UTERUS     DILATION AND EVACUATION N/A 02/26/2015   Procedure: DILATATION AND EVACUATION;  Surgeon: Nadara Mustard, MD;  Location: [facility];  Service: Gynecology;  Laterality: N/A;   DILATION AND EVACUATION N/A 05/26/2015   Procedure: DILATATION AND EVACUATION;  Surgeon: [place] Bing, MD;  Location: [facility];  Service: Gynecology;  Laterality: N/A;    Social History   Socioeconomic History   Marital status: Single    Spouse name: Not on file    Number of children: 1   Years of education: Not on file   Highest education level: Not on file  Occupational History   Not on file  Tobacco Use   Smoking status: Never   Smokeless tobacco: Never  Vaping Use   Vaping status: Never Used  Substance and Sexual Activity   Alcohol use: No   Drug use: No   Sexual activity: Yes    Birth control/protection: I.U.D.    Comment: Mirena  Other Topics Concern   Not on file  Social History Narrative   Not on file   Social Drivers of Health   Financial Resource Strain: Not on file  Food Insecurity: Not on file  Transportation Needs: Not on file  Physical Activity: Not on file  Stress: Not on file  Social Connections: Not on file  Intimate Partner Violence: Not on file    Family History  Problem Relation Age of Onset   Pancreatic cancer Maternal Aunt    Cancer Maternal Aunt 51       ovar vs pancreatic--unknown primary   Ovarian cancer Maternal Aunt    Ovarian cancer Paternal Aunt        64s   Ovarian cancer Maternal Grandmother        unsure of age   Pancreatic cancer Maternal Grandmother 81   Lung cancer Other 43   Ovarian cancer Cousin  late 30s   Breast cancer Neg Hx     No Known Allergies  Review of Systems  Psychiatric/Behavioral:  The patient has insomnia (waking up gasping for air).   All other systems reviewed and are negative.      Objective:   BP 104/66   Pulse 75   Ht 5\' 2"  (1.575 m)   Wt 210 lb 6.4 oz (95.4 kg)   SpO2 98%   BMI 38.48 kg/m   Vitals:   11/17/23 1530  BP: 104/66  Pulse: 75  Height: 5\' 2"  (1.575 m)  Weight: 210 lb 6.4 oz (95.4 kg)  SpO2: 98%  BMI (Calculated): 38.47    Physical Exam Vitals and nursing note reviewed.  Constitutional:      Appearance: Normal appearance. She is normal weight.  HENT:     Head: Normocephalic.  Eyes:     Extraocular Movements: Extraocular movements intact.     Conjunctiva/sclera: Conjunctivae normal.     Pupils: Pupils are equal, round,  and reactive to light.  Cardiovascular:     Rate and Rhythm: Normal rate.  Pulmonary:     Effort: Pulmonary effort is normal.     Breath sounds: Normal breath sounds.  Musculoskeletal:        General: Normal range of motion.  Neurological:     General: No focal deficit present.     Mental Status: She is alert and oriented to person, place, and time. Mental status is at baseline.  Psychiatric:        Mood and Affect: Mood normal.        Behavior: Behavior normal.        Thought Content: Thought content normal.        Judgment: Judgment normal.      Results for orders placed or performed in visit on 11/17/23  Lipid panel  Result Value Ref Range   Cholesterol, Total 151 100 - 199 mg/dL   Triglycerides 84 0 - 149 mg/dL   HDL 47 >16 mg/dL   VLDL Cholesterol Cal 16 5 - 40 mg/dL   LDL Chol Calc (NIH) 88 0 - 99 mg/dL   Chol/HDL Ratio 3.2 0.0 - 4.4 ratio  VITAMIN D 25 Hydroxy (Vit-D Deficiency, Fractures)  Result Value Ref Range   Vit D, 25-Hydroxy 16.6 (L) 30.0 - 100.0 ng/mL  CMP14+EGFR  Result Value Ref Range   Glucose 107 (H) 70 - 99 mg/dL   BUN 10 6 - 20 mg/dL   Creatinine, Ser 1.09 0.57 - 1.00 mg/dL   eGFR 604 >54 UJ/WJX/9.14   BUN/Creatinine Ratio 14 9 - 23   Sodium 140 134 - 144 mmol/L   Potassium 3.8 3.5 - 5.2 mmol/L   Chloride 102 96 - 106 mmol/L   CO2 25 20 - 29 mmol/L   Calcium 9.2 8.7 - 10.2 mg/dL   Total Protein 7.1 6.0 - 8.5 g/dL   Albumin 4.5 3.9 - 4.9 g/dL   Globulin, Total 2.6 1.5 - 4.5 g/dL   Bilirubin Total 0.4 0.0 - 1.2 mg/dL   Alkaline Phosphatase 71 44 - 121 IU/L   AST 15 0 - 40 IU/L   ALT 13 0 - 32 IU/L  TSH  Result Value Ref Range   TSH 0.903 0.450 - 4.500 uIU/mL  Hemoglobin A1c  Result Value Ref Range   Hgb A1c MFr Bld 5.8 (H) 4.8 - 5.6 %   Est. average glucose Bld gHb Est-mCnc 120 mg/dL  Vitamin N82  Result Value Ref Range  Vitamin B-12 637 232 - 1,245 pg/mL  CBC with Diff  Result Value Ref Range   WBC 7.2 3.4 - 10.8 x10E3/uL   RBC  4.29 3.77 - 5.28 x10E6/uL   Hemoglobin 12.8 11.1 - 15.9 g/dL   Hematocrit 16.1 09.6 - 46.6 %   MCV 90 79 - 97 fL   MCH 29.8 26.6 - 33.0 pg   MCHC 33.3 31.5 - 35.7 g/dL   RDW 04.5 40.9 - 81.1 %   Platelets 236 150 - 450 x10E3/uL   Neutrophils 55 Not Estab. %   Lymphs 30 Not Estab. %   Monocytes 11 Not Estab. %   Eos 3 Not Estab. %   Basos 1 Not Estab. %   Neutrophils Absolute 4.0 1.4 - 7.0 x10E3/uL   Lymphocytes Absolute 2.2 0.7 - 3.1 x10E3/uL   Monocytes Absolute 0.8 0.1 - 0.9 x10E3/uL   EOS (ABSOLUTE) 0.2 0.0 - 0.4 x10E3/uL   Basophils Absolute 0.1 0.0 - 0.2 x10E3/uL   Immature Granulocytes 0 Not Estab. %   Immature Grans (Abs) 0.0 0.0 - 0.1 x10E3/uL    Recent Results (from the past 2160 hours)  Lipid panel     Status: None   Collection Time: 11/17/23  4:04 PM  Result Value Ref Range   Cholesterol, Total 151 100 - 199 mg/dL   Triglycerides 84 0 - 149 mg/dL   HDL 47 >91 mg/dL   VLDL Cholesterol Cal 16 5 - 40 mg/dL   LDL Chol Calc (NIH) 88 0 - 99 mg/dL   Chol/HDL Ratio 3.2 0.0 - 4.4 ratio    Comment:                                   T. Chol/HDL Ratio                                             Men  Women                               1/2 Avg.Risk  3.4    3.3                                   Avg.Risk  5.0    4.4                                2X Avg.Risk  9.6    7.1                                3X Avg.Risk 23.4   11.0   VITAMIN D 25 Hydroxy (Vit-D Deficiency, Fractures)     Status: Abnormal   Collection Time: 11/17/23  4:04 PM  Result Value Ref Range   Vit D, 25-Hydroxy 16.6 (L) 30.0 - 100.0 ng/mL    Comment: Vitamin D deficiency has been defined by the Institute of Medicine and an Endocrine Society practice guideline as a level of serum 25-OH vitamin D less than 20 ng/mL (1,2). The Endocrine Society went on to further define vitamin D insufficiency as  a level between 21 and 29 ng/mL (2). 1. IOM (Institute of Medicine). 2010. Dietary reference    intakes for calcium  and D. [place]: The    [employer]. 2. Holick MF, [place], Bischoff-Ferrari HA, et al.    Evaluation, treatment, and prevention of vitamin D    deficiency: an Endocrine Society clinical practice    guideline. JCEM. 2011 Jul; 96(7):1911-30.   CMP14+EGFR     Status: Abnormal   Collection Time: 11/17/23  4:04 PM  Result Value Ref Range   Glucose 107 (H) 70 - 99 mg/dL   BUN 10 6 - 20 mg/dL   Creatinine, Ser 1.61 0.57 - 1.00 mg/dL   eGFR 096 >04 VW/UJW/1.19   BUN/Creatinine Ratio 14 9 - 23   Sodium 140 134 - 144 mmol/L   Potassium 3.8 3.5 - 5.2 mmol/L   Chloride 102 96 - 106 mmol/L   CO2 25 20 - 29 mmol/L   Calcium 9.2 8.7 - 10.2 mg/dL   Total Protein 7.1 6.0 - 8.5 g/dL   Albumin 4.5 3.9 - 4.9 g/dL   Globulin, Total 2.6 1.5 - 4.5 g/dL   Bilirubin Total 0.4 0.0 - 1.2 mg/dL   Alkaline Phosphatase 71 44 - 121 IU/L   AST 15 0 - 40 IU/L   ALT 13 0 - 32 IU/L  TSH     Status: None   Collection Time: 11/17/23  4:04 PM  Result Value Ref Range   TSH 0.903 0.450 - 4.500 uIU/mL  Hemoglobin A1c     Status: Abnormal   Collection Time: 11/17/23  4:04 PM  Result Value Ref Range   Hgb A1c MFr Bld 5.8 (H) 4.8 - 5.6 %    Comment:          Prediabetes: 5.7 - 6.4          Diabetes: >6.4          Glycemic control for adults with diabetes: <7.0    Est. average glucose Bld gHb Est-mCnc 120 mg/dL  Vitamin J47     Status: None   Collection Time: 11/17/23  4:04 PM  Result Value Ref Range   Vitamin B-12 637 232 - 1,245 pg/mL  CBC with Diff     Status: None   Collection Time: 11/17/23  4:04 PM  Result Value Ref Range   WBC 7.2 3.4 - 10.8 x10E3/uL   RBC 4.29 3.77 - 5.28 x10E6/uL   Hemoglobin 12.8 11.1 - 15.9 g/dL   Hematocrit 82.9 56.2 - 46.6 %   MCV 90 79 - 97 fL   MCH 29.8 26.6 - 33.0 pg   MCHC 33.3 31.5 - 35.7 g/dL   RDW 13.0 86.5 - 78.4 %   Platelets 236 150 - 450 x10E3/uL   Neutrophils 55 Not Estab. %   Lymphs 30 Not Estab. %   Monocytes 11 Not Estab. %   Eos 3 Not  Estab. %   Basos 1 Not Estab. %   Neutrophils Absolute 4.0 1.4 - 7.0 x10E3/uL   Lymphocytes Absolute 2.2 0.7 - 3.1 x10E3/uL   Monocytes Absolute 0.8 0.1 - 0.9 x10E3/uL   EOS (ABSOLUTE) 0.2 0.0 - 0.4 x10E3/uL   Basophils Absolute 0.1 0.0 - 0.2 x10E3/uL   Immature Granulocytes 0 Not Estab. %   Immature Grans (Abs) 0.0 0.0 - 0.1 x10E3/uL       Assessment & Plan:   Problem List Items Addressed This Visit       Other  BMI 33.0-33.9,adult   Continue current meds.  Will adjust as needed based on results.  The patient is asked to make an attempt to improve diet and exercise patterns to aid in medical management of this problem. Addressed importance of increasing and maintaining water intake.        Relevant Orders   CMP14+EGFR (Completed)   CBC with Diff (Completed)   B12 deficiency due to diet   Checking labs today.  Will continue supplements as needed.        Relevant Orders   CMP14+EGFR (Completed)   Vitamin B12 (Completed)   CBC with Diff (Completed)   Vitamin D deficiency, unspecified   Checking labs today.  Will continue supplements as needed.        Relevant Orders   VITAMIN D 25 Hydroxy (Vit-D Deficiency, Fractures) (Completed)   CMP14+EGFR (Completed)   CBC with Diff (Completed)   Other Visit Diagnoses       Witnessed episode of apnea    -  Primary   Setting up for sleep study. Will await results of testing for further decisions on treatment   Relevant Orders   Ambulatory referral to Sleep Studies   CMP14+EGFR (Completed)   CBC with Diff (Completed)     Gasping for breath       Setting up for sleep study. Will await results of testing for further decisions on treatment   Relevant Orders   Ambulatory referral to Sleep Studies   CMP14+EGFR (Completed)   CBC with Diff (Completed)     Prediabetes       A1C is in prediabetic ranges. Patient counseled on dietary choices and verbalized understanding. Will reassess at follow up after next lab check.    Relevant Orders   CMP14+EGFR (Completed)   Hemoglobin A1c (Completed)   CBC with Diff (Completed)     Mixed hyperlipidemia       Checking labs today.  Continue current therapy for lipid control. Will modify as needed based on labwork results.   Relevant Orders   CMP14+EGFR (Completed)   CBC with Diff (Completed)     Other fatigue       Relevant Orders   CMP14+EGFR (Completed)   TSH (Completed)   CBC with Diff (Completed)     Essential hypertension, benign       Blood pressure well controlled with current medications.  Continue current therapy.  Will reassess at follow up.   Relevant Orders   Lipid panel (Completed)   CMP14+EGFR (Completed)   CBC with Diff (Completed)       Return in about 3 months (around 02/17/2024) for F/U.   Total time spent: 20 minutes  Miki Kins, FNP  11/17/2023   This document may have been prepared by [facility] Voice Recognition software and as such may include unintentional dictation errors.

## 2023-12-10 NOTE — Assessment & Plan Note (Signed)
 Checking labs today.  Will continue supplements as needed.

## 2023-12-10 NOTE — Assessment & Plan Note (Signed)
 Continue current meds.  Will adjust as needed based on results.  The patient is asked to make an attempt to improve diet and exercise patterns to aid in medical management of this problem. Addressed importance of increasing and maintaining water intake.

## 2023-12-14 NOTE — Telephone Encounter (Signed)
 A user error has taken place: encounter opened in error, closed for administrative reasons.

## 2024-05-16 ENCOUNTER — Other Ambulatory Visit: Payer: Self-pay | Admitting: Family

## 2024-05-29 DIAGNOSIS — D1801 Hemangioma of skin and subcutaneous tissue: Secondary | ICD-10-CM | POA: Insufficient documentation

## 2024-06-11 ENCOUNTER — Other Ambulatory Visit: Payer: Self-pay | Admitting: Family

## 2024-09-06 ENCOUNTER — Ambulatory Visit
Admission: EM | Admit: 2024-09-06 | Discharge: 2024-09-06 | Disposition: A | Discharge status: Home / Self Care | Payer: Self-pay

## 2024-09-06 DIAGNOSIS — J101 Influenza due to other identified influenza virus with other respiratory manifestations: Secondary | ICD-10-CM | POA: Diagnosis not present

## 2024-09-06 LAB — POCT INFLUENZA A/B
Influenza A, POC: NEGATIVE
Influenza B, POC: NEGATIVE

## 2024-09-06 LAB — POCT RAPID STREP A (OFFICE): Rapid Strep A Screen: NEGATIVE

## 2024-09-06 LAB — POC SOFIA SARS ANTIGEN FIA: SARS Coronavirus 2 Ag: NEGATIVE

## 2024-09-06 MED ORDER — OSELTAMIVIR PHOSPHATE 75 MG PO CAPS: 75.0000 mg | ORAL_CAPSULE | Freq: Two times a day (BID) | ORAL | 0 refills | Status: DC

## 2024-09-06 NOTE — Discharge Instructions (Addendum)
 Your flu test is positive for influenza B.  COVID and strep are negative.    If desired, take the Tamiflu  as directed.  Take Tylenol  or ibuprofen  as needed for fever or discomfort.  Rest and keep yourself hydrated.    Follow up with your primary care provider.  Go to the emergency department if you have worsening symptoms.

## 2024-09-06 NOTE — ED Provider Notes (Signed)
 " Amber Duffy    CSN: [id number] Arrival date & time: 09/06/24  9171      History   Chief Complaint Chief Complaint  Patient presents with   Sore Throat   Facial Pain    HPI Amber Duffy is a 33 y.o. female.  Patient presents with 1 day history of sore throat, ear pain, runny nose, congestion, cough.  No fever, shortness of breath, vomiting, diarrhea.  She has been treating her symptoms with Mucinex.  Her medical history includes migraine headaches.  The history is provided by the patient and medical records.    Past Medical History:  Diagnosis Date   BRCA negative 04/11/2023   MyRisk neg; IBIS=8.9%/riskscore=6.8%   Family history of ovarian cancer    Family history of pancreatic cancer    Migraine    with aura   Missed abortions 2010   Obesity (BMI 35.0-39.9 without comorbidity)    Postpartum care following cesarean delivery 12/29/2016   Status post cesarean section 12/28/2016    Patient Active Problem List   Diagnosis Date Noted   B12 deficiency due to diet 12/10/2023   Vitamin D  deficiency, unspecified 12/10/2023   IUD (intrauterine device) in place 09/29/2021   Postoperative anemia due to acute blood loss 12/28/2016   Maternal varicella, non-immune 12/28/2016   BMI 33.0-33.9,adult 12/14/2016    Past Surgical History:  Procedure Laterality Date   CESAREAN SECTION N/A 12/26/2016   Procedure: CESAREAN SECTION;  Surgeon: Glory High, MD;  Location: [facility];  Service: Obstetrics;  Laterality: N/A;   DILATION AND CURETTAGE OF UTERUS     DILATION AND EVACUATION N/A 02/26/2015   Procedure: DILATATION AND EVACUATION;  Surgeon: Lamar SHAUNNA Lesches, MD;  Location: [facility];  Service: Gynecology;  Laterality: N/A;   DILATION AND EVACUATION N/A 05/26/2015   Procedure: DILATATION AND EVACUATION;  Surgeon: Bebe Furry, MD;  Location: [facility];  Service: Gynecology;  Laterality: N/A;    OB History     Gravida  5   Para  2   Term  2   Preterm  0   AB   3   Living  2      SAB  3   IAB      Ectopic      Multiple  0   Live Births  2            Home Medications    Prior to Admission medications  Medication Sig Start Date End Date Taking? Authorizing Provider  oseltamivir  (TAMIFLU ) 75 MG capsule Take 1 capsule (75 mg total) by mouth every 12 (twelve) hours. 09/06/24  Yes Corlis Burnard DEL, NP  spironolactone (ALDACTONE) 100 MG tablet Take 100 mg by mouth daily.   Yes [provider]  cyanocobalamin  (VITAMIN B12) 1000 MCG/ML injection INJECT 1 ML (1,000 MCG TOTAL) INTO THE SKIN EVERY 30 (THIRTY) DAYS. 05/17/24   Orlean Alan HERO, FNP  doxycycline  (VIBRA -TABS) 100 MG tablet Take 100 mg by mouth daily. 08/23/23   [provider]  fluticasone  (FLONASE ) 50 MCG/ACT nasal spray SPRAY 2 SPRAYS INTO EACH NOSTRIL EVERY DAY 06/12/24   Orlean Alan HERO, FNP  levonorgestrel  (MIRENA ) 20 MCG/24HR IUD 1 each by Intrauterine route once. Patient not taking: Reported on 11/17/2023    [provider]  minocycline (MINOCIN) 50 MG capsule Take 1 tablet once daily. Take with food and avoid laying down for 30 minutes after taking medication. Patient not taking: Reported on 11/17/2023 03/13/23   [provider]  tretinoin (RETIN-A) 0.025 % cream Apply to face nightly. Use a pea sized amount, avoiding the eyelids. Start 2 nights a week and increase in frequency as dryness allows. Patient not taking: Reported on 11/17/2023 03/13/23   [provider]  Vitamin D , Ergocalciferol , (DRISDOL ) 1.25 MG (50000 UNIT) CAPS capsule Take 1 capsule (50,000 Units total) by mouth every 7 (seven) days. 11/23/23   Orlean Alan HERO, FNP    Family History Family History  Problem Relation Age of Onset   Pancreatic cancer Maternal Aunt    Cancer Maternal Aunt 51       ovar vs pancreatic--unknown primary   Ovarian cancer Maternal Aunt    Ovarian cancer Paternal Aunt        60s   Ovarian cancer Maternal Grandmother        unsure of age    Pancreatic cancer Maternal Grandmother 91   Lung cancer Other 18   Ovarian cancer Cousin        late 30s   Breast cancer Neg Hx     Social History Social History[1]   Allergies   Patient has no known allergies.   Review of Systems Review of Systems  Constitutional:  Negative for chills and fever.  HENT:  Positive for congestion, ear pain, rhinorrhea and sore throat.   Respiratory:  Positive for cough. Negative for shortness of breath.   Gastrointestinal:  Negative for diarrhea and vomiting.     Physical Exam Triage Vital Signs ED Triage Vitals [09/06/24 0952]  Encounter Vitals Group     BP      Girls Systolic BP Percentile      Girls Diastolic BP Percentile      Boys Systolic BP Percentile      Boys Diastolic BP Percentile      Pulse      Resp      Temp      Temp src      SpO2      Weight      Height      Head Circumference      Peak Flow      Pain Score 7     Pain Loc      Pain Education      Exclude from Growth Chart    No data found.  Updated Vital Signs BP 112/78 (BP Location: Right Arm)   Pulse 69   Temp 98 F (36.7 C) (Oral)   Resp 16   LMP  (LMP Unknown)   SpO2 96%   Visual Acuity Right Eye Distance:   Left Eye Distance:   Bilateral Distance:    Right Eye Near:   Left Eye Near:    Bilateral Near:     Physical Exam Constitutional:      General: She is not in acute distress. HENT:     Right Ear: Tympanic membrane normal.     Left Ear: Tympanic membrane normal.     Nose: Nose normal.     Mouth/Throat:     Mouth: Mucous membranes are moist.     Pharynx: Oropharynx is clear.  Cardiovascular:     Rate and Rhythm: Normal rate and regular rhythm.     Heart sounds: Normal heart sounds.  Pulmonary:     Effort: Pulmonary effort is normal. No respiratory distress.     Breath sounds: Normal breath sounds.  Neurological:     Mental Status: She is alert.      UC Treatments / Results  Labs (  all labs ordered are listed, but only  abnormal results are displayed) Labs Reviewed  POCT RAPID STREP A (OFFICE) - Normal  POCT INFLUENZA A/B - Normal  POC SOFIA SARS ANTIGEN FIA - Normal    EKG   Radiology No results found.  Procedures Procedures (including critical care time)  Medications Ordered in UC Medications - No data to display  Initial Impression / Assessment and Plan / UC Course  I have reviewed the triage vital signs and the nursing notes.  Pertinent labs & imaging results that were available during my care of the patient were reviewed by me and considered in my medical decision making (see chart for details).    Influenza B.  Rapid flu test positive for influenza A.  COVID and strep are negative.  Treating with Tamiflu .  Discussed symptomatic treatment including Tylenol  or ibuprofen  as needed, rest, hydration.  Education provided on influenza.  Instructed patient to follow-up with her PCP.  ED precautions given.  Patient agrees to plan of care.   Final Clinical Impressions(s) / UC Diagnoses   Final diagnoses:  Influenza B     Discharge Instructions      Your flu test is positive for influenza B.  COVID and strep are negative.    If desired, take the Tamiflu  as directed.  Take Tylenol  or ibuprofen  as needed for fever or discomfort.  Rest and keep yourself hydrated.    Follow up with your primary care provider.  Go to the emergency department if you have worsening symptoms.        ED Prescriptions     Medication Sig Dispense Auth. Provider   oseltamivir  (TAMIFLU ) 75 MG capsule Take 1 capsule (75 mg total) by mouth every 12 (twelve) hours. 10 capsule Corlis Burnard DEL, NP      PDMP not reviewed this encounter.    [1]  Social History Tobacco Use   Smoking status: Never   Smokeless tobacco: Never  Vaping Use   Vaping status: Never Used  Substance Use Topics   Alcohol use: No   Drug use: No     Corlis Burnard DEL, NP 09/06/24 1013  "

## 2024-09-06 NOTE — ED Triage Notes (Signed)
 Sore throat, runny nose, watery eyes, ear fullness, sinus pressure, cough x 1 day. Taking mucinex.

## 2024-10-01 ENCOUNTER — Encounter: Payer: Self-pay | Admitting: Family

## 2024-10-01 ENCOUNTER — Ambulatory Visit: Admitting: Family

## 2024-10-01 VITALS — BP 102/78 | HR 82 | Ht 62.0 in | Wt 214.4 lb

## 2024-10-01 DIAGNOSIS — E559 Vitamin D deficiency, unspecified: Secondary | ICD-10-CM

## 2024-10-01 DIAGNOSIS — E6609 Other obesity due to excess calories: Secondary | ICD-10-CM | POA: Diagnosis not present

## 2024-10-01 DIAGNOSIS — E538 Deficiency of other specified B group vitamins: Secondary | ICD-10-CM

## 2024-10-01 DIAGNOSIS — Z6839 Body mass index (BMI) 39.0-39.9, adult: Secondary | ICD-10-CM

## 2024-10-01 DIAGNOSIS — R5383 Other fatigue: Secondary | ICD-10-CM

## 2024-10-01 DIAGNOSIS — E66812 Obesity, class 2: Secondary | ICD-10-CM | POA: Diagnosis not present

## 2024-10-01 DIAGNOSIS — E782 Mixed hyperlipidemia: Secondary | ICD-10-CM

## 2024-10-01 DIAGNOSIS — F321 Major depressive disorder, single episode, moderate: Secondary | ICD-10-CM | POA: Diagnosis not present

## 2024-10-01 DIAGNOSIS — R7303 Prediabetes: Secondary | ICD-10-CM | POA: Diagnosis not present

## 2024-10-01 DIAGNOSIS — Z0130 Encounter for examination of blood pressure without abnormal findings: Secondary | ICD-10-CM

## 2024-10-01 DIAGNOSIS — G43009 Migraine without aura, not intractable, without status migrainosus: Secondary | ICD-10-CM | POA: Diagnosis not present

## 2024-10-01 MED ORDER — VITAMIN D (ERGOCALCIFEROL) 1.25 MG (50000 UNIT) PO CAPS: 50000.0000 [IU] | ORAL_CAPSULE | ORAL | 3 refills | Status: AC

## 2024-10-01 MED ORDER — UBRELVY 100 MG PO TABS: ORAL_TABLET | ORAL | 6 refills | Status: AC

## 2024-10-01 MED ORDER — WEGOVY 1.5 MG PO TABS: 1.5000 mg | ORAL_TABLET | Freq: Every day | ORAL | 0 refills | Status: AC

## 2024-10-01 MED ORDER — BUPROPION HCL ER (XL) 150 MG PO TB24: 150.0000 mg | ORAL_TABLET | Freq: Every day | ORAL | 3 refills | Status: AC

## 2024-10-01 NOTE — Assessment & Plan Note (Signed)
 Checking labs today.  Will continue supplements as needed.   - Vitamin D  - Vitamin B12 - TSH

## 2024-10-01 NOTE — Progress Notes (Signed)
 "  Established Patient Office Visit  Subjective:  Patient ID: Amber Duffy, female    DOB: January 01, 1991  Age: 34 y.o. MRN: [id number]  Chief Complaint  Patient presents with   Follow-up    HPI  No other concerns at this time.   Past Medical History:  Diagnosis Date   BRCA negative 04/11/2023   MyRisk neg; IBIS=8.9%/riskscore=6.8%   Family history of ovarian cancer    Family history of pancreatic cancer    Migraine    with aura   Missed abortions 2010   Obesity (BMI 35.0-39.9 without comorbidity)    Postpartum care following cesarean delivery 12/29/2016   Status post cesarean section 12/28/2016    Past Surgical History:  Procedure Laterality Date   CESAREAN SECTION N/A 12/26/2016   Procedure: CESAREAN SECTION;  Surgeon: Glory High, MD;  Location: [facility];  Service: Obstetrics;  Laterality: N/A;   DILATION AND CURETTAGE OF UTERUS     DILATION AND EVACUATION N/A 02/26/2015   Procedure: DILATATION AND EVACUATION;  Surgeon: Lamar SHAUNNA Lesches, MD;  Location: [facility];  Service: Gynecology;  Laterality: N/A;   DILATION AND EVACUATION N/A 05/26/2015   Procedure: DILATATION AND EVACUATION;  Surgeon: Bebe Furry, MD;  Location: [facility];  Service: Gynecology;  Laterality: N/A;    Social History   Socioeconomic History   Marital status: Single    Spouse name: Not on file   Number of children: 1   Years of education: Not on file   Highest education level: Not on file  Occupational History   Not on file  Tobacco Use   Smoking status: Never   Smokeless tobacco: Never  Vaping Use   Vaping status: Never Used  Substance and Sexual Activity   Alcohol use: No   Drug use: No   Sexual activity: Yes    Birth control/protection: I.U.D.    Comment: Mirena   Other Topics Concern   Not on file  Social History Narrative   Not on file   Social Drivers of Health   Tobacco Use: Low Risk (10/01/2024)   Patient History    Smoking Tobacco Use: Never    Smokeless Tobacco Use:  Never    Passive Exposure: Not on file  Financial Resource Strain: Not on file  Food Insecurity: Not on file  Transportation Needs: Not on file  Physical Activity: Not on file  Stress: Not on file  Social Connections: Not on file  Intimate Partner Violence: Not on file  Depression (PHQ2-9): Medium Risk (11/17/2023)   Depression (PHQ2-9)    PHQ-2 Score: 9  Alcohol Screen: Not on file  Housing: Not on file  Utilities: Not on file  Health Literacy: Not on file    Family History  Problem Relation Age of Onset   Pancreatic cancer Maternal Aunt    Cancer Maternal Aunt 51       ovar vs pancreatic--unknown primary   Ovarian cancer Maternal Aunt    Ovarian cancer Paternal Aunt        42s   Ovarian cancer Maternal Grandmother        unsure of age   Pancreatic cancer Maternal Grandmother 6   Lung cancer Other 51   Ovarian cancer Cousin        late 30s   Breast cancer Neg Hx     Allergies[1]  Review of Systems  All other systems reviewed and are negative.      Objective:   BP 102/78   Pulse 82   Ht  5' 2 (1.575 m)   Wt 214 lb 6.4 oz (97.3 kg)   LMP  (LMP Unknown)   SpO2 98%   BMI 39.21 kg/m   Vitals:   10/01/24 0935  BP: 102/78  Pulse: 82  Height: 5' 2 (1.575 m)  Weight: 214 lb 6.4 oz (97.3 kg)  SpO2: 98%  BMI (Calculated): 39.2    Physical Exam Vitals and nursing note reviewed.  Constitutional:      Appearance: Normal appearance. She is normal weight.  HENT:     Head: Normocephalic.  Eyes:     Extraocular Movements: Extraocular movements intact.     Conjunctiva/sclera: Conjunctivae normal.     Pupils: Pupils are equal, round, and reactive to light.  Cardiovascular:     Rate and Rhythm: Normal rate.  Pulmonary:     Effort: Pulmonary effort is normal.  Neurological:     General: No focal deficit present.     Mental Status: She is alert and oriented to person, place, and time. Mental status is at baseline.  Psychiatric:        Mood and Affect: Mood  normal.        Behavior: Behavior normal.        Thought Content: Thought content normal.        Judgment: Judgment normal.      No results found for any visits on 10/01/24.  Recent Results (from the past 2160 hours)  POCT rapid strep A     Status: Normal   Collection Time: 09/06/24 10:03 AM  Result Value Ref Range   Rapid Strep A Screen Negative Negative  POCT Influenza A/B     Status: Normal   Collection Time: 09/06/24 10:10 AM  Result Value Ref Range   Influenza A, POC Negative Negative   Influenza B, POC Negative Negative  POC SARS Coronavirus 2 Ag     Status: Normal   Collection Time: 09/06/24 10:10 AM  Result Value Ref Range   SARS Coronavirus 2 Ag Negative Negative       Assessment & Plan B12 deficiency due to diet Vitamin D  deficiency, unspecified Other fatigue Checking labs today.  Will continue supplements as needed.   - Vitamin D  - Vitamin B12 - TSH  Migraine without aura and without status migrainosus, not intractable Patient given samples of Ubrelvy  in office today.  RX sent as well, and patient given copay card with instructions to activate.   Will recheck at follow up.   Prediabetes A1C Continues to be in prediabetic ranges.  Will reassess at follow up after next lab check.  Patient counseled on dietary choices and verbalized understanding.   -CBC w/Diff -CMP w/eGFR -Hemoglobin A1C  Current moderate episode of major depressive disorder without prior episode (HCC) Starting patient on Wellbutrin  today.  Will recheck symptoms at 1 month follow up.   Mixed hyperlipidemia Checking labs today.  Continue current therapy for lipid control. Will modify as needed based on labwork results.   -CMP w/eGFR -Lipid Panel  Class 2 obesity due to excess calories without serious comorbidity with body mass index (BMI) of 39.0 to 39.9 in adult Starting patient on Wegovy  tablets.  Will start at 1.5 mg dose, may increase at follow up appointment in 1 month  based on tolerance.    Return in about 1 month (around 11/01/2024).   Total time spent: 30 minutes  ALAN CHRISTELLA ARRANT, FNP  10/01/2024   This document may have been prepared by [employer] Voice Recognition software and  as such may include unintentional dictation errors.      [1] No Known Allergies  "

## 2024-10-01 NOTE — Assessment & Plan Note (Signed)
 Starting patient on Wegovy  tablets.  Will start at 1.5 mg dose, may increase at follow up appointment in 1 month based on tolerance.

## 2024-10-01 NOTE — Assessment & Plan Note (Signed)
 Patient given samples of Ubrelvy  in office today.  RX sent as well, and patient given copay card with instructions to activate.   Will recheck at follow up.

## 2024-10-01 NOTE — Assessment & Plan Note (Signed)
 Starting patient on Wellbutrin  today.  Will recheck symptoms at 1 month follow up.

## 2024-10-02 LAB — CMP14+EGFR
ALT: 28 IU/L (ref 0–32)
AST: 23 IU/L (ref 0–40)
Albumin: 4.5 g/dL (ref 3.9–4.9)
Alkaline Phosphatase: 76 IU/L (ref 41–116)
BUN/Creatinine Ratio: 14 (ref 9–23)
BUN: 26 mg/dL — ABNORMAL HIGH (ref 6–20)
Bilirubin Total: 0.2 mg/dL (ref 0.0–1.2)
CO2: 24 mmol/L (ref 20–29)
Calcium: 9.8 mg/dL (ref 8.7–10.2)
Chloride: 100 mmol/L (ref 96–106)
Creatinine, Ser: 1.81 mg/dL — ABNORMAL HIGH (ref 0.57–1.00)
Globulin, Total: 2.4 g/dL (ref 1.5–4.5)
Glucose: 111 mg/dL — ABNORMAL HIGH (ref 70–99)
Potassium: 5.4 mmol/L — ABNORMAL HIGH (ref 3.5–5.2)
Sodium: 139 mmol/L (ref 134–144)
Total Protein: 6.9 g/dL (ref 6.0–8.5)
eGFR: 37 mL/min/1.73 — ABNORMAL LOW

## 2024-10-02 LAB — CBC WITH DIFFERENTIAL/PLATELET
Basophils Absolute: 0 x10E3/uL (ref 0.0–0.2)
Basos: 1 %
EOS (ABSOLUTE): 0.1 x10E3/uL (ref 0.0–0.4)
Eos: 1 %
Hematocrit: 39.4 % (ref 34.0–46.6)
Hemoglobin: 13 g/dL (ref 11.1–15.9)
Immature Grans (Abs): 0 x10E3/uL (ref 0.0–0.1)
Immature Granulocytes: 0 %
Lymphocytes Absolute: 1.9 x10E3/uL (ref 0.7–3.1)
Lymphs: 24 %
MCH: 29.7 pg (ref 26.6–33.0)
MCHC: 33 g/dL (ref 31.5–35.7)
MCV: 90 fL (ref 79–97)
Monocytes Absolute: 0.6 x10E3/uL (ref 0.1–0.9)
Monocytes: 8 %
Neutrophils Absolute: 5.3 x10E3/uL (ref 1.4–7.0)
Neutrophils: 66 %
Platelets: 230 x10E3/uL (ref 150–450)
RBC: 4.37 x10E6/uL (ref 3.77–5.28)
RDW: 12.8 % (ref 11.7–15.4)
WBC: 8 x10E3/uL (ref 3.4–10.8)

## 2024-10-02 LAB — IRON,TIBC AND FERRITIN PANEL
Ferritin: 301 ng/mL — ABNORMAL HIGH (ref 15–150)
Iron Saturation: 24 % (ref 15–55)
Iron: 66 ug/dL (ref 27–159)
Total Iron Binding Capacity: 275 ug/dL (ref 250–450)
UIBC: 209 ug/dL (ref 131–425)

## 2024-10-02 LAB — VITAMIN D 25 HYDROXY (VIT D DEFICIENCY, FRACTURES): Vit D, 25-Hydroxy: 35.1 ng/mL (ref 30.0–100.0)

## 2024-10-02 LAB — HEMOGLOBIN A1C
Est. average glucose Bld gHb Est-mCnc: 117 mg/dL
Hgb A1c MFr Bld: 5.7 % — ABNORMAL HIGH (ref 4.8–5.6)

## 2024-10-02 LAB — LIPID PANEL
Chol/HDL Ratio: 2.9 ratio (ref 0.0–4.4)
Cholesterol, Total: 174 mg/dL (ref 100–199)
HDL: 59 mg/dL
LDL Chol Calc (NIH): 87 mg/dL (ref 0–99)
Triglycerides: 167 mg/dL — ABNORMAL HIGH (ref 0–149)
VLDL Cholesterol Cal: 28 mg/dL (ref 5–40)

## 2024-10-02 LAB — TSH: TSH: 2.24 u[IU]/mL (ref 0.450–4.500)

## 2024-10-02 LAB — VITAMIN B12: Vitamin B-12: 1099 pg/mL (ref 232–1245)

## 2024-10-17 ENCOUNTER — Ambulatory Visit: Payer: Self-pay

## 2024-10-17 ENCOUNTER — Other Ambulatory Visit: Payer: Self-pay

## 2024-10-17 DIAGNOSIS — E6609 Other obesity due to excess calories: Secondary | ICD-10-CM

## 2024-11-01 ENCOUNTER — Ambulatory Visit: Admitting: Family
